# Patient Record
Sex: Male | Born: 1965 | Race: White | Hispanic: No | Marital: Single | Smoking: Former smoker
Health system: Southern US, Community
[De-identification: ages and names within clinical notes are randomized; demographics above are authoritative.]

## PROBLEM LIST (undated history)

## (undated) DIAGNOSIS — S0990XA Unspecified injury of head, initial encounter: Secondary | ICD-10-CM

## (undated) DIAGNOSIS — F4310 Post-traumatic stress disorder, unspecified: Secondary | ICD-10-CM

## (undated) DIAGNOSIS — E119 Type 2 diabetes mellitus without complications: Secondary | ICD-10-CM

## (undated) DIAGNOSIS — I10 Essential (primary) hypertension: Secondary | ICD-10-CM

## (undated) HISTORY — PX: OTHER SURGICAL HISTORY: SHX169

---

## 2002-11-22 ENCOUNTER — Emergency Department (HOSPITAL_COMMUNITY): Admission: EM | Admit: 2002-11-22 | Discharge: 2002-11-22 | Payer: Self-pay | Admitting: Emergency Medicine

## 2003-08-01 ENCOUNTER — Other Ambulatory Visit: Payer: Self-pay

## 2003-08-06 ENCOUNTER — Other Ambulatory Visit: Payer: Self-pay

## 2006-03-07 ENCOUNTER — Emergency Department (HOSPITAL_COMMUNITY): Admission: EM | Admit: 2006-03-07 | Discharge: 2006-03-07 | Payer: Self-pay | Admitting: Emergency Medicine

## 2006-09-28 ENCOUNTER — Emergency Department: Payer: Self-pay | Admitting: Emergency Medicine

## 2007-08-17 ENCOUNTER — Emergency Department: Payer: Self-pay | Admitting: Emergency Medicine

## 2008-10-02 ENCOUNTER — Emergency Department: Payer: Self-pay | Admitting: Emergency Medicine

## 2012-06-09 ENCOUNTER — Emergency Department: Payer: Self-pay | Admitting: Emergency Medicine

## 2016-02-06 ENCOUNTER — Emergency Department
Admission: EM | Admit: 2016-02-06 | Discharge: 2016-02-06 | Disposition: A | Discharge status: Home / Self Care | Payer: Medicare Other | Attending: Student in an Organized Health Care Education/Training Program | Admitting: Student in an Organized Health Care Education/Training Program

## 2016-02-06 ENCOUNTER — Emergency Department: Payer: Medicare Other

## 2016-02-06 DIAGNOSIS — B369 Superficial mycosis, unspecified: Secondary | ICD-10-CM

## 2016-02-06 DIAGNOSIS — L2082 Flexural eczema: Secondary | ICD-10-CM

## 2016-02-06 DIAGNOSIS — Z87891 Personal history of nicotine dependence: Secondary | ICD-10-CM | POA: Diagnosis not present

## 2016-02-06 DIAGNOSIS — R21 Rash and other nonspecific skin eruption: Secondary | ICD-10-CM | POA: Diagnosis present

## 2016-02-06 DIAGNOSIS — B356 Tinea cruris: Secondary | ICD-10-CM | POA: Diagnosis not present

## 2016-02-06 DIAGNOSIS — B353 Tinea pedis: Secondary | ICD-10-CM

## 2016-02-06 DIAGNOSIS — H6240 Otitis externa in other diseases classified elsewhere, unspecified ear: Secondary | ICD-10-CM

## 2016-02-06 HISTORY — DX: Unspecified injury of head, initial encounter: S09.90XA

## 2016-02-06 LAB — GLUCOSE, CAPILLARY: GLUCOSE-CAPILLARY: 136 mg/dL — AB (ref 65–99)

## 2016-02-06 MED ORDER — HYDROCODONE-ACETAMINOPHEN 5-325 MG PO TABS
1.0000 | ORAL_TABLET | Freq: Once | ORAL | Status: AC
  Administered 2016-02-06: 1 via ORAL
  Filled 2016-02-06: qty 1

## 2016-02-06 MED ORDER — CLOTRIMAZOLE 1 % EX CREA
TOPICAL_CREAM | Freq: Two times a day (BID) | CUTANEOUS | Status: DC
  Administered 2016-02-06: 19:00:00 via TOPICAL
  Filled 2016-02-06 (×2): qty 15

## 2016-02-06 MED ORDER — FLUCONAZOLE 50 MG PO TABS
150.0000 mg | ORAL_TABLET | Freq: Once | ORAL | Status: AC
  Administered 2016-02-06: 150 mg via ORAL
  Filled 2016-02-06: qty 1

## 2016-02-06 MED ORDER — CARBAMIDE PEROXIDE 6.5 % OT SOLN
5.0000 [drp] | Freq: Once | OTIC | Status: AC
  Administered 2016-02-06: 5 [drp] via OTIC
  Filled 2016-02-06: qty 15

## 2016-02-06 MED ORDER — CLOTRIMAZOLE 1 % EX SOLN: 1.0000 "application " | Freq: Two times a day (BID) | CUTANEOUS | 0 refills | Status: AC

## 2016-02-06 MED ORDER — EUCERIN EX LOTN: TOPICAL_LOTION | CUTANEOUS | 0 refills | Status: DC | PRN

## 2016-02-06 MED ORDER — CLOTRIMAZOLE-BETAMETHASONE 1-0.05 % EX CREA: TOPICAL_CREAM | CUTANEOUS | 1 refills | Status: AC

## 2016-02-06 MED ORDER — FLUCONAZOLE 150 MG PO TABS: 150.0000 mg | ORAL_TABLET | ORAL | 0 refills | Status: DC

## 2016-02-06 MED ORDER — NEOMYCIN-POLYMYXIN-HC 3.5-10000-1 OT SOLN: 3.0000 [drp] | Freq: Four times a day (QID) | OTIC | 0 refills | Status: DC

## 2016-02-06 NOTE — ED Notes (Signed)
Pt has multiple questions for md. md in to reassess and speak with pt.

## 2016-02-06 NOTE — ED Triage Notes (Signed)
Pt came to ED via EMS from home c/o rash to bilateral feet. Reports noticed it a few weeks ago, but today rash worsened and pt unable to walk. Blister like rash, oozing. VS stable.

## 2016-02-06 NOTE — ED Provider Notes (Signed)
[facility] Emergency Department Provider Note    First MD Initiated Contact with Patient 02/06/16 1857     (approximate)  I have reviewed the triage vital signs and the nursing notes.   HISTORY  Chief Complaint Rash    HPI FALLOU HULBERT is a 50 y.o. male presents with several months of bilateral foot pain and rash spreading to his groin. 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 States that the past 2 days the rash has been unbearable. States that he and his wife have been putting Listerine on the rash try to get it to go away. Denies any fevers no nausea or vomiting. States she is also noted difficulty hearing. States that the rash initially started between his toes on both feet and was burning and itching sensation. With more frequent use of the Listerine his pain became worse he started having blistering. Had severe pain today. Denies any pain in his calf. no Lower STEMI swelling.  Denies any history of being immunocompromised. Denies any history of HIV.   Past Medical History:  Diagnosis Date  . Head injury    No family history on file. Past Surgical History:  Procedure Laterality Date  . head surgery     There are no active problems to display for this patient.     Prior to Admission medications   Not on File    Allergies Patient has no known allergies.    Social History Social History  Substance Use Topics  . Smoking status: Former Research scientist (life sciences)  . Smokeless tobacco: Never Used  . Alcohol use No    Review of Systems Patient denies headaches, rhinorrhea, blurry vision, numbness, shortness of breath, chest pain, edema, cough, abdominal pain, nausea, vomiting, diarrhea, dysuria, fevers, rashes or hallucinations unless otherwise stated above in HPI. ____________________________________________   PHYSICAL EXAM:  VITAL SIGNS: Vitals:   02/06/16 1847  BP: 136/75  Pulse: 80  Resp: 16  Temp: 97.5 F (36.4 C)     Constitutional: Alert and oriented. Well appearing and in no acute distress. Eyes: Conjunctivae are normal. PERRL. EOMI. Head: Atraumatic. Nose: No congestion/rhinnorhea. Mouth/Throat: Mucous membranes are moist.  Oropharynx non-erythematous. No oropharyngeal lesions or blisters. Ears: Thick yellow debris and external ear canal without underlying erythema. The TMs are clear and nonerythematous bilaterally. He has no posterior mastoid tenderness to palpation Neck: No stridor. Painless ROM. No cervical spine tenderness to palpation Hematological/Lymphatic/Immunilogical: No cervical lymphadenopathy. Cardiovascular: Normal rate, regular rhythm. Grossly normal heart sounds.  Good peripheral circulation. Respiratory: Normal respiratory effort.  No retractions. Lungs CTAB. Gastrointestinal: Soft and nontender. No distention. No abdominal bruits. No CVA tenderness. Musculoskeletal: No lower extremity tenderness nor edema.  No joint effusions. Neurologic:  Normal speech and language. No gross focal neurologic deficits are appreciated. No gait instability. Skin:  Has diffuse eczematous changes to bilateral flexural surfaces of upper extremities. Also with evidence of severe tenia pedis without evidence of surrounding cellulitis. He has no crepitus to the feet. No ulcerations. Psychiatric: Mood and affect are normal. Speech and behavior are normal.  ____________________________________________   LABS (all labs ordered are listed, but only abnormal results are displayed)  Results for orders placed or performed during the hospital encounter of 02/06/16 (from the past 24 hour(s))  Glucose, capillary     Status: Abnormal   Collection Time: 02/06/16  6:49 PM  Result Value Ref Range   Glucose-Capillary 136 (H) 65 - 99 mg/dL   ____________________________________________  EKG____________________________________________  RADIOLOGY  I personally reviewed all radiographic images ordered to evaluate  for the above acute complaints and reviewed radiology reports and findings.  These findings were personally discussed with the patient.  Please see medical record for radiology report.  ____________________________________________   PROCEDURES  Procedure(s) performed:  Procedures    Critical Care performed: no ____________________________________________   INITIAL IMPRESSION / ASSESSMENT AND PLAN / ED COURSE  Pertinent labs & imaging results that were available during my care of the patient were reviewed by me and considered in my medical decision making (see chart for details).  DDX: tinea, cellultiis, aom, aoe  Rydge C Conard is a 50 y.o. who presents to the ED with diffuse tinea pedis and cruris as well as O2 mycosis. Patient otherwise afebrile hemodynamically stable. Does not appear to be superinfected at this time. X-ray ordered to evaluate for any evidence of subcutaneous gas shows normal x-ray. Clinically consistent with osteomyelitis. Symptoms have been ongoing for 3 months now and likely worsened by a home remedy with Listerine. Exam and history is consistent with tinea. We'll provide systemic treatment, a prescription for topical clotrimazole. Have provided a Department of bilateral ears and will treat with both antifungal audit drops as well as otic antibiotics.  Presentation is not likely consistent with Stevens-Johnson's or erythema multiforme. Not clinically consistent with necrotizing soft tissue infection. This is clinically consistent with diffuse tinea. Have discussed home hygiene and signs and symptoms for which patient should return to the ER. Patient remains human dynamically stable and in no acute distress. Based on the duration of symptoms I do not feel that laboratory testing clinically indicated at this time.  Patient was able to tolerate PO and was able to ambulate with a steady gait.  Have discussed with the patient and available family all diagnostics and  treatments performed thus far and all questions were answered to the best of my ability. The patient demonstrates understanding and agreement with plan.   Clinical Course      ____________________________________________   FINAL CLINICAL IMPRESSION(S) / ED DIAGNOSES  Final diagnoses:  Tinea pedis of both feet  Otomycosis  Flexural eczema  Tinea cruris      NEW MEDICATIONS STARTED DURING THIS VISIT:  New Prescriptions   No medications on file     Note:  This document was prepared using Dragon voice recognition software and may include unintentional dictation errors.    Merlyn Lot, MD 02/06/16 2138

## 2016-02-06 NOTE — ED Notes (Signed)
Pt with what appears to be bad althelete's foot to bilateral upper feet and to all area of bilateral toes. Pt with rash like tinea extending to bilateral ankles, up to bilateral legs and in groin. Yeast like rash is noted to bilateral groin folds, behind testicles, to folds of panus, and extending up to anterior pubis and lower abd. Pt has eczema like rash noted to bilateral anterior and posterior forearms and upper arms. Pt with eczema like rash noted to forehead beneath band of where pt wears ball cap. Pt denies known fever. Pt states rash has been present for "weeks". Pt states he feels like it is difficult to move his toes. Pt states feet burn and itch and are painful to touch. Pt's feet are weeping serous like fluid. Skin to bilateral feet is scaling and hot to touch.

## 2017-10-13 ENCOUNTER — Observation Stay
Admission: EM | Admit: 2017-10-13 | Discharge: 2017-10-15 | Disposition: A | Discharge status: Home / Self Care | Payer: Medicare Other | Attending: Internal Medicine | Admitting: Internal Medicine

## 2017-10-13 ENCOUNTER — Emergency Department: Payer: Medicare Other

## 2017-10-13 ENCOUNTER — Encounter: Payer: Self-pay | Admitting: Emergency Medicine

## 2017-10-13 DIAGNOSIS — Z87891 Personal history of nicotine dependence: Secondary | ICD-10-CM | POA: Diagnosis not present

## 2017-10-13 DIAGNOSIS — F4310 Post-traumatic stress disorder, unspecified: Secondary | ICD-10-CM | POA: Insufficient documentation

## 2017-10-13 DIAGNOSIS — Z87820 Personal history of traumatic brain injury: Secondary | ICD-10-CM | POA: Insufficient documentation

## 2017-10-13 DIAGNOSIS — T63461A Toxic effect of venom of wasps, accidental (unintentional), initial encounter: Secondary | ICD-10-CM | POA: Diagnosis not present

## 2017-10-13 DIAGNOSIS — R0902 Hypoxemia: Secondary | ICD-10-CM | POA: Diagnosis present

## 2017-10-13 DIAGNOSIS — Z6841 Body Mass Index (BMI) 40.0 and over, adult: Secondary | ICD-10-CM | POA: Insufficient documentation

## 2017-10-13 DIAGNOSIS — J441 Chronic obstructive pulmonary disease with (acute) exacerbation: Secondary | ICD-10-CM

## 2017-10-13 DIAGNOSIS — R0609 Other forms of dyspnea: Secondary | ICD-10-CM | POA: Insufficient documentation

## 2017-10-13 DIAGNOSIS — E1165 Type 2 diabetes mellitus with hyperglycemia: Secondary | ICD-10-CM | POA: Diagnosis not present

## 2017-10-13 DIAGNOSIS — I10 Essential (primary) hypertension: Secondary | ICD-10-CM | POA: Diagnosis not present

## 2017-10-13 DIAGNOSIS — T7840XA Allergy, unspecified, initial encounter: Secondary | ICD-10-CM

## 2017-10-13 DIAGNOSIS — Z79899 Other long term (current) drug therapy: Secondary | ICD-10-CM | POA: Diagnosis not present

## 2017-10-13 DIAGNOSIS — R21 Rash and other nonspecific skin eruption: Secondary | ICD-10-CM | POA: Diagnosis not present

## 2017-10-13 DIAGNOSIS — R0600 Dyspnea, unspecified: Secondary | ICD-10-CM

## 2017-10-13 HISTORY — DX: Post-traumatic stress disorder, unspecified: F43.10

## 2017-10-13 HISTORY — DX: Essential (primary) hypertension: I10

## 2017-10-13 HISTORY — DX: Type 2 diabetes mellitus without complications: E11.9

## 2017-10-13 LAB — CBC WITH DIFFERENTIAL/PLATELET
BASOS ABS: 0.1 10*3/uL (ref 0–0.1)
Basophils Relative: 1 %
Eosinophils Absolute: 0 10*3/uL (ref 0–0.7)
Eosinophils Relative: 1 %
HEMATOCRIT: 44.5 % (ref 40.0–52.0)
HEMOGLOBIN: 15.2 g/dL (ref 13.0–18.0)
LYMPHS PCT: 38 %
Lymphs Abs: 3.1 10*3/uL (ref 1.0–3.6)
MCH: 30.7 pg (ref 26.0–34.0)
MCHC: 34.2 g/dL (ref 32.0–36.0)
MCV: 89.6 fL (ref 80.0–100.0)
Monocytes Absolute: 0.3 10*3/uL (ref 0.2–1.0)
Monocytes Relative: 4 %
NEUTROS ABS: 4.6 10*3/uL (ref 1.4–6.5)
Neutrophils Relative %: 56 %
Platelets: 303 10*3/uL (ref 150–440)
RBC: 4.96 MIL/uL (ref 4.40–5.90)
RDW: 14.6 % — AB (ref 11.5–14.5)
WBC: 8.1 10*3/uL (ref 3.8–10.6)

## 2017-10-13 LAB — COMPREHENSIVE METABOLIC PANEL
ALK PHOS: 74 U/L (ref 38–126)
ALT: 19 U/L (ref 0–44)
AST: 25 U/L (ref 15–41)
Albumin: 3.6 g/dL (ref 3.5–5.0)
Anion gap: 6 (ref 5–15)
BILIRUBIN TOTAL: 0.6 mg/dL (ref 0.3–1.2)
BUN: 20 mg/dL (ref 6–20)
CALCIUM: 8.6 mg/dL — AB (ref 8.9–10.3)
CO2: 26 mmol/L (ref 22–32)
CREATININE: 0.99 mg/dL (ref 0.61–1.24)
Chloride: 106 mmol/L (ref 98–111)
GFR calc Af Amer: >60 mL/min (ref >60)
Glucose, Bld: 169 mg/dL — ABNORMAL HIGH (ref 70–99)
Potassium: 3.8 mmol/L (ref 3.5–5.1)
Sodium: 138 mmol/L (ref 135–145)
TOTAL PROTEIN: 7.6 g/dL (ref 6.5–8.1)

## 2017-10-13 LAB — TROPONIN I

## 2017-10-13 MED ORDER — HYDROCOD POLST-CPM POLST ER 10-8 MG/5ML PO SUER
5.0000 mL | Freq: Once | ORAL | Status: AC
  Administered 2017-10-13: 5 mL via ORAL
  Filled 2017-10-13: qty 5

## 2017-10-13 NOTE — ED Triage Notes (Signed)
Patient brought in from home by ems with complaint of shortness of breath at rest that started about 21:00 tonight. Patient was given 1 duoneb and 125 mg solumedrol by ems.

## 2017-10-13 NOTE — ED Provider Notes (Signed)
[facility] Emergency Department Provider Note   ____________________________________________   None    (approximate)  I have reviewed the triage vital signs and the nursing notes.   HISTORY  Chief Complaint Shortness of Breath    HPI Colin Simpson is a 52 y.o. male brought to the ED from home via EMS with a chief complaint of cough and shortness of breath.  Patient reports nonproductive cough x1 day with progressive shortness of breath.  He was given 125 IV Solu-Medrol and 1 DuoNeb prior to arrival by EMS with improvement in his symptoms.  Denies associated fever, chills, chest pain, abdominal pain, nausea or vomiting.  Denies recent travel or trauma.   Past Medical History:  Diagnosis Date  . Diabetes mellitus without complication (HCC)   . Head injury   . Hypertension     There are no active problems to display for this patient.   Past Surgical History:  Procedure Laterality Date  . head surgery      Prior to Admission medications   Medication Sig Start Date End Date Taking? Authorizing Provider  Emollient (EUCERIN) lotion Apply topically as needed for dry skin. Apply to areas of dry skin 02/06/16   Willy Eddyobinson, Patrick, MD  fluconazole (DIFLUCAN) 150 MG tablet Take 1 tablet (150 mg total) by mouth once a week. 02/06/16   Willy Eddyobinson, Patrick, MD  neomycin-polymyxin-hydrocortisone (CORTISPORIN) otic solution Place 3 drops into both ears 4 (four) times daily. 02/06/16   Willy Eddyobinson, Patrick, MD    Allergies Patient has no known allergies.  No family history on file.  Social History Social History   Tobacco Use  . Smoking status: Former Games developermoker  . Smokeless tobacco: Never Used  Substance Use Topics  . Alcohol use: No  . Drug use: No    Review of Systems  Constitutional: No fever/chills Eyes: No visual changes. ENT: No sore throat. Cardiovascular: Denies chest pain. Respiratory: Positive for nonproductive cough and shortness of  breath. Gastrointestinal: No abdominal pain.  No nausea, no vomiting.  No diarrhea.  No constipation. Genitourinary: Negative for dysuria. Musculoskeletal: Negative for back pain. Skin: Negative for rash. Neurological: Negative for headaches, focal weakness or numbness.   ____________________________________________   PHYSICAL EXAM:  VITAL SIGNS: ED Triage Vitals  Enc Vitals Group     BP 10/13/17 2301 127/83     Pulse Rate 10/13/17 2301 76     Resp 10/13/17 2301 20     Temp --      Temp src --      SpO2 10/13/17 2301 100 %     Weight 10/13/17 2302 (!) 304 lb (137.9 kg)     Height 10/13/17 2302 5\' 10"  (1.778 m)     Head Circumference --      Peak Flow --      Pain Score 10/13/17 2302 8     Pain Loc --      Pain Edu? --      Excl. in GC? --     Constitutional: Alert and oriented. Well appearing and in mild acute distress. Eyes: Conjunctivae are normal. PERRL. EOMI. Head: Atraumatic. Nose: No congestion/rhinnorhea. Mouth/Throat: Mucous membranes are moist.  Oropharynx non-erythematous. Neck: No stridor.   Cardiovascular: Normal rate, regular rhythm. Grossly normal heart sounds.  Good peripheral circulation. Respiratory: Normal respiratory effort.  No retractions. Lungs with scattered rhonchi. Gastrointestinal: Obese.  Soft and nontender. No distention. No abdominal bruits. No CVA tenderness. Musculoskeletal: No lower extremity tenderness nor edema.  No  joint effusions. Neurologic:  Normal speech and language. No gross focal neurologic deficits are appreciated.  Skin:  Skin is warm, dry and intact. No rash noted.  Patchy areas of psoriasis noted on extremities. Psychiatric: Mood and affect are normal. Speech and behavior are normal.  ____________________________________________   LABS (all labs ordered are listed, but only abnormal results are displayed)  Labs Reviewed  CBC WITH DIFFERENTIAL/PLATELET - Abnormal; Notable for the following components:      Result Value     RDW 14.6 (*)    All other components within normal limits  COMPREHENSIVE METABOLIC PANEL - Abnormal; Notable for the following components:   Glucose, Bld 169 (*)    Calcium 8.6 (*)    All other components within normal limits  TROPONIN I  BRAIN NATRIURETIC PEPTIDE   ____________________________________________  EKG  ED ECG REPORT I, SUNG,JADE J, the attending physician, personally viewed and interpreted this ECG.   Date: 10/13/2017  EKG Time: 2301  Rate: 78  Rhythm: normal EKG, normal sinus rhythm  Axis: Normal  Intervals:none  ST&T Change: Nonspecific  ____________________________________________  RADIOLOGY  ED MD interpretation: No acute cardiopulmonary process  Official radiology report(s): Dg Chest 2 View  Result Date: 10/13/2017 CLINICAL DATA:  Patient with shortness of breath EXAM: CHEST - 2 VIEW COMPARISON:  None. FINDINGS: Normal cardiac and mediastinal contours. Elevation right hemidiaphragm. No consolidative pulmonary opacities. No pleural effusion or pneumothorax. Thoracic spine degenerative changes. IMPRESSION: No acute cardiopulmonary process. Electronically Signed   By: Annia Belt M.D.   On: 10/13/2017 23:56    ____________________________________________   PROCEDURES  Procedure(s) performed: None  Procedures  Critical Care performed: Yes, see critical care note(s)   CRITICAL CARE Performed by: Irean Hong   Total critical care time: 30 minutes  Critical care time was exclusive of separately billable procedures and treating other patients.  Critical care was necessary to treat or prevent imminent or life-threatening deterioration.  Critical care was time spent personally by me on the following activities: development of treatment plan with patient and/or surrogate as well as nursing, discussions with consultants, evaluation of patient's response to treatment, examination of patient, obtaining history from patient or surrogate, ordering and  performing treatments and interventions, ordering and review of laboratory studies, ordering and review of radiographic studies, pulse oximetry and re-evaluation of patient's condition.  ____________________________________________   INITIAL IMPRESSION / ASSESSMENT AND PLAN / ED COURSE  As part of my medical decision making, I reviewed the following data within the electronic MEDICAL RECORD NUMBER Nursing notes reviewed and incorporated, Labs reviewed, EKG interpreted, Old chart reviewed, Radiograph reviewed and Notes from prior ED visits   52 year old male with diabetes and hypertension, former smoker, who presents with nonproductive cough and shortness of breath. Differential includes, but is not limited to, viral syndrome, bronchitis including COPD exacerbation, pneumonia, reactive airway disease including asthma, CHF including exacerbation with or without pulmonary/interstitial edema, pneumothorax, ACS, thoracic trauma, and pulmonary embolism.  Patient feeling better after DuoNeb and 125 IV Solu-Medrol administered by EMS.  Will check basic lab work, troponin, chest x-ray.  Administer Tussionex for cough and reassess.   Clinical Course as of Oct 14 109  Sat Oct 14, 2017  0108 Patient's room air saturations 90%.  Feels like he needs a additional breathing treatment.  Lungs are diminished on reexamination.  Also now a family member is at the bedside and tells me his face looks swollen.  Patient denies new medicines or exposure.  Unsure if he takes  lisinopril.  There is no tongue angioedema.  No need for IM epinephrine at this time for possible allergic reaction.  Will administer second DuoNeb and discuss with hospitalist services to evaluate patient in emergency department for admission.   [JS]    Clinical Course User Index [JS] Irean Hong, MD     ____________________________________________   FINAL CLINICAL IMPRESSION(S) / ED DIAGNOSES  Final diagnoses:  COPD exacerbation (HCC)    Hypoxia  Allergic reaction, initial encounter     ED Discharge Orders    None       Note:  This document was prepared using Dragon voice recognition software and may include unintentional dictation errors.   Irean Hong, MD 10/14/17 [id number]

## 2017-10-14 ENCOUNTER — Encounter: Payer: Self-pay | Admitting: Internal Medicine

## 2017-10-14 ENCOUNTER — Other Ambulatory Visit: Payer: Self-pay

## 2017-10-14 DIAGNOSIS — R0600 Dyspnea, unspecified: Secondary | ICD-10-CM

## 2017-10-14 LAB — GLUCOSE, CAPILLARY
GLUCOSE-CAPILLARY: 162 mg/dL — AB (ref 70–99)
Glucose-Capillary: 155 mg/dL — ABNORMAL HIGH (ref 70–99)
Glucose-Capillary: 184 mg/dL — ABNORMAL HIGH (ref 70–99)
Glucose-Capillary: 134 mg/dL — ABNORMAL HIGH (ref 70–99)
Glucose-Capillary: 128 mg/dL — ABNORMAL HIGH (ref 70–99)

## 2017-10-14 LAB — BRAIN NATRIURETIC PEPTIDE: B Natriuretic Peptide: 25 pg/mL (ref 0.0–100.0)

## 2017-10-14 MED ORDER — CHOLECALCIFEROL 10 MCG (400 UNIT) PO TABS
400.0000 [IU] | ORAL_TABLET | Freq: Every day | ORAL | Status: DC
  Administered 2017-10-15: 400 [IU] via ORAL
  Filled 2017-10-14: qty 1

## 2017-10-14 MED ORDER — IPRATROPIUM-ALBUTEROL 0.5-2.5 (3) MG/3ML IN SOLN
3.0000 mL | Freq: Four times a day (QID) | RESPIRATORY_TRACT | Status: DC
  Administered 2017-10-14: 3 mL via RESPIRATORY_TRACT
  Filled 2017-10-14 (×2): qty 3

## 2017-10-14 MED ORDER — ACETAMINOPHEN 325 MG PO TABS: 650.0000 mg | ORAL_TABLET | Freq: Four times a day (QID) | ORAL | Status: DC | PRN

## 2017-10-14 MED ORDER — MUPIROCIN CALCIUM 2 % EX CREA
TOPICAL_CREAM | Freq: Two times a day (BID) | CUTANEOUS | Status: DC
  Administered 2017-10-14: 08:00:00 via TOPICAL
  Administered 2017-10-14: 1 via TOPICAL
  Administered 2017-10-14 – 2017-10-15 (×2): via TOPICAL
  Filled 2017-10-14: qty 15

## 2017-10-14 MED ORDER — ENOXAPARIN SODIUM 40 MG/0.4ML ~~LOC~~ SOLN
40.0000 mg | Freq: Two times a day (BID) | SUBCUTANEOUS | Status: DC
  Administered 2017-10-14 (×2): 40 mg via SUBCUTANEOUS
  Filled 2017-10-14 (×2): qty 0.4

## 2017-10-14 MED ORDER — SIMVASTATIN 20 MG PO TABS
40.0000 mg | ORAL_TABLET | Freq: Every day | ORAL | Status: DC
  Administered 2017-10-14: 40 mg via ORAL
  Filled 2017-10-14: qty 2

## 2017-10-14 MED ORDER — ONDANSETRON HCL 4 MG/2ML IJ SOLN: 4.0000 mg | Freq: Four times a day (QID) | INTRAMUSCULAR | Status: DC | PRN

## 2017-10-14 MED ORDER — IPRATROPIUM-ALBUTEROL 0.5-2.5 (3) MG/3ML IN SOLN
3.0000 mL | Freq: Once | RESPIRATORY_TRACT | Status: AC
  Administered 2017-10-14: 3 mL via RESPIRATORY_TRACT

## 2017-10-14 MED ORDER — PREDNISONE 20 MG PO TABS
40.0000 mg | ORAL_TABLET | Freq: Every day | ORAL | Status: DC
  Administered 2017-10-14 – 2017-10-15 (×2): 40 mg via ORAL
  Filled 2017-10-14 (×2): qty 2

## 2017-10-14 MED ORDER — BISACODYL 5 MG PO TBEC: 5.0000 mg | DELAYED_RELEASE_TABLET | Freq: Every day | ORAL | Status: DC | PRN

## 2017-10-14 MED ORDER — IPRATROPIUM-ALBUTEROL 0.5-2.5 (3) MG/3ML IN SOLN: 3.0000 mL | RESPIRATORY_TRACT | Status: DC | PRN

## 2017-10-14 MED ORDER — SODIUM CHLORIDE 0.9 % IV BOLUS: 500.0000 mL | Freq: Once | INTRAVENOUS | Status: DC

## 2017-10-14 MED ORDER — DIPHENHYDRAMINE HCL 25 MG PO CAPS
25.0000 mg | ORAL_CAPSULE | Freq: Four times a day (QID) | ORAL | Status: DC | PRN
  Administered 2017-10-14 (×2): 25 mg via ORAL
  Filled 2017-10-14 (×2): qty 1

## 2017-10-14 MED ORDER — INSULIN ASPART 100 UNIT/ML ~~LOC~~ SOLN: 0.0000 [IU] | Freq: Every day | SUBCUTANEOUS | Status: DC

## 2017-10-14 MED ORDER — PAROXETINE HCL 10 MG PO TABS
10.0000 mg | ORAL_TABLET | Freq: Every day | ORAL | Status: DC
  Administered 2017-10-15: 10 mg via ORAL
  Filled 2017-10-14 (×2): qty 1

## 2017-10-14 MED ORDER — INSULIN ASPART 100 UNIT/ML ~~LOC~~ SOLN
0.0000 [IU] | Freq: Three times a day (TID) | SUBCUTANEOUS | Status: DC
  Administered 2017-10-14: 18:00:00 1 [IU] via SUBCUTANEOUS
  Administered 2017-10-14 (×2): 2 [IU] via SUBCUTANEOUS
  Filled 2017-10-14 (×3): qty 1

## 2017-10-14 MED ORDER — SENNOSIDES-DOCUSATE SODIUM 8.6-50 MG PO TABS: 1.0000 | ORAL_TABLET | Freq: Every evening | ORAL | Status: DC | PRN

## 2017-10-14 MED ORDER — ONDANSETRON HCL 4 MG PO TABS: 4.0000 mg | ORAL_TABLET | Freq: Four times a day (QID) | ORAL | Status: DC | PRN

## 2017-10-14 MED ORDER — ACETAMINOPHEN 650 MG RE SUPP: 650.0000 mg | Freq: Four times a day (QID) | RECTAL | Status: DC | PRN

## 2017-10-14 MED ORDER — IPRATROPIUM-ALBUTEROL 0.5-2.5 (3) MG/3ML IN SOLN
RESPIRATORY_TRACT | Status: AC
  Filled 2017-10-14: qty 3

## 2017-10-14 MED ORDER — PANTOPRAZOLE SODIUM 40 MG PO TBEC
40.0000 mg | DELAYED_RELEASE_TABLET | Freq: Every day | ORAL | Status: DC
  Administered 2017-10-15: 08:00:00 40 mg via ORAL
  Filled 2017-10-14: qty 1

## 2017-10-14 NOTE — Plan of Care (Signed)
  Problem: Education: Goal: Knowledge of General Education information will improve Description Including pain rating scale, medication(s)/side effects and non-pharmacologic comfort measures Outcome: Progressing   Problem: Health Behavior/Discharge Planning: Goal: Ability to manage health-related needs will improve Outcome: Progressing  Pt able to verbalize that he was stung by a Wasp prior to admission on his left posterior neck area Problem: Clinical Measurements: Goal: Will remain free from infection Outcome: Progressing Goal: Respiratory complications will improve Outcome: Progressing  PRN Benadryl ordered by Dr Imogene Burnhen this shift d/t h/o Wasp sting Problem: Activity: Goal: Risk for activity intolerance will decrease Outcome: Progressing  Pt independent in room; remains on low fall risks Problem: Nutrition: Goal: Adequate nutrition will be maintained Outcome: Progressing  Pt on Heart Healthy/Carb Modified diet Problem: Pain Managment: Goal: General experience of comfort will improve Outcome: Progressing  Pt denies pain; only difficulty swallowing Problem: Safety: Goal: Ability to remain free from injury will improve Outcome: Progressing  Pt remains on low fall risks this shift

## 2017-10-14 NOTE — Plan of Care (Signed)

## 2017-10-14 NOTE — Care Management (Signed)
Patient admitted to [facility] under observation status for dyspnea. RNCM consulted on patient to provide MOON letter and complete assessment. Patient currently [place] with his parents and sister [place]. Patient is independent at baseline and is able to complete activities of daily living. Family provides transport. PCP is Franco Nonesheryl Lindley. No issues obtaining medications.

## 2017-10-14 NOTE — H&P (Addendum)
[employer] [facility] [place] [facility]   PATIENT NAME: Colin SalmonRickie Simpson    MR#:  [id number]  DATE OF BIRTH:  [birth year]  DATE OF ADMISSION:  10/13/2017  PRIMARY CARE PHYSICIAN: Armando GangLindley, Cheryl P, FNP   REQUESTING/REFERRING PHYSICIAN: Irean HongSung, Jade J, MD  CHIEF COMPLAINT:   Chief Complaint  Patient presents with  . Shortness of Breath    HISTORY OF PRESENT ILLNESS:  Colin Simpson  is a 52 y.o. male with a known history of morbid obesity, T2NIDDM, HTN, TBI, PTSD p/w 1d Hx sudden-onset dyspnea. Pt w/ Hx TBI + PTSD, 2/2 fall off ladder 14-7237yrs ago. Seems to have some degree of intellectual disability, but is AAOx3, and is able to provide some limited Hx relating to present admission. He answers questions, follows commands and responds appropriately. Circumstances surrounding pt's hospitalization also d/w pt's family at bedside, as well as w/ pt's sister over the phone. The family is able to provide exceedingly little additional information relating to pt's present admission. They also seem to be unable to tell me what specific medications the pt is presently taking at home, and can only list broad drug groups (i.e. "blood pressure medication" and "a statin").  Pt states that he felt fine up until Friday 10/13/2017 evening. @~1800PM, pt developed sudden acute SOB, difficulty speaking. Onset while pt was at rest watching television. Endorses cough, intermittently productive of small qty clear sputum. Denies hemoptysis or chest pain. Denies diaphoresis and rigors. Endorses swelling of the face and lips around the time the SOB started. Denies any other complaints, states he otherwise feels fine. His family says he was started on new medication recently, but no further specific information is provided. I was initially told pt may possibly be suffering from a COPD exacerbation, but he is a non-smoker and does not have Hx of COPD. His lungs are clear, but air movement is impaired. I have  a greater suspicion for an allergic reaction of some sort, w/ reactive airways. Etiological agent presently unclear.  Pt also appears to have a rash on his B/L feet up to the ankles. He has a similar mild process on the posterior of his B/L hands, as well as on his forehead. Process appears psoriatic or eczematous, possibly vasculitic, less likely infectious. Based on what I am being told by family, pt has received multiple courses of Tx w/ outpt ABx, w/o improvement of rash. This does not appear to be a new issue, w/ 02/06/2016 ED documentation noting presence of B/L foot rash.  PAST MEDICAL HISTORY:   Past Medical History:  Diagnosis Date  . Diabetes mellitus without complication (HCC)   . Head injury   . Hypertension     PAST SURGICAL HISTORY:   Past Surgical History:  Procedure Laterality Date  . head surgery      SOCIAL HISTORY:   Social History   Tobacco Use  . Smoking status: Former Games developermoker  . Smokeless tobacco: Never Used  Substance Use Topics  . Alcohol use: No    FAMILY HISTORY:  History reviewed. No pertinent family history.  DRUG ALLERGIES:  No Known Allergies  REVIEW OF SYSTEMS:   Review of Systems  Constitutional: Negative for chills, diaphoresis, fever, malaise/fatigue and weight loss.  HENT: Negative for congestion, ear pain, hearing loss, nosebleeds, sinus pain, sore throat and tinnitus.   Eyes: Negative for blurred vision, double vision and photophobia.  Respiratory: Positive for cough, sputum production and shortness of breath. Negative for hemoptysis and wheezing.  Cardiovascular: Negative for chest pain, palpitations, orthopnea, claudication, leg swelling and PND.  Gastrointestinal: Negative for abdominal pain, blood in stool, constipation, diarrhea, heartburn, melena, nausea and vomiting.  Genitourinary: Negative for dysuria, frequency, hematuria and urgency.  Musculoskeletal: Negative for back pain, joint pain, myalgias and neck pain.  Skin:  Positive for rash. Negative for itching.  Neurological: Negative for dizziness, tingling, tremors, sensory change, speech change, focal weakness, seizures, loss of consciousness, weakness and headaches.  Psychiatric/Behavioral: Negative for memory loss. The patient does not have insomnia.    MEDICATIONS AT HOME:   Prior to Admission medications   Not on File   VITAL SIGNS:  Blood pressure 127/83, pulse 76, temperature 97.8 F (36.6 C), temperature source Oral, resp. rate 20, height 5\' 10"  (1.778 m), weight (!) 137.9 kg, SpO2 100 %.  PHYSICAL EXAMINATION:  Physical Exam  Constitutional: He is oriented to person, place, and time. He appears well-developed and well-nourished. He is active and cooperative.  Non-toxic appearance. He does not have a sickly appearance. He does not appear ill. No distress. He is not intubated. Nasal cannula in place.  HENT:  Head: Atraumatic.  Mouth/Throat: Oropharynx is clear and moist. No oropharyngeal exudate.  Eyes: EOM and lids are normal.  Neck: Neck supple. No JVD present. No thyromegaly present.  Cardiovascular: Normal rate, regular rhythm, S1 normal and S2 normal.  No extrasystoles are present. Exam reveals no gallop, no S3, no S4, no distant heart sounds and no friction rub.  Murmur heard.  Systolic murmur is present with a grade of 2/6. Pulmonary/Chest: Effort normal. No accessory muscle usage or stridor. No apnea, no tachypnea and no bradypnea. He is not intubated. No respiratory distress. He has decreased breath sounds in the right upper field, the right middle field, the right lower field, the left upper field, the left middle field and the left lower field. He has no wheezes. He has no rhonchi. He has no rales.  Abdominal: Soft. Bowel sounds are normal. He exhibits no distension. There is no tenderness. There is no rigidity, no rebound and no guarding.  Musculoskeletal:       Right lower leg: He exhibits no edema.       Left lower leg: He exhibits  no edema.  Feet:  Right Foot:  Skin Integrity: Positive for skin breakdown.  Left Foot:  Skin Integrity: Positive for skin breakdown.  Lymphadenopathy:    He has no cervical adenopathy.  Neurological: He is alert and oriented to person, place, and time. He is not disoriented.  Skin: Skin is warm and dry. Rash noted. He is not diaphoretic.  Psychiatric: He has a normal mood and affect. His speech is normal and behavior is normal. Judgment and thought content normal. His mood appears not anxious. He is not agitated. Cognition and memory are impaired.   (-) wheezing, (+) poor air mvmt. (-) leg edema. (+) B/L foot rash up to ankles. LABORATORY PANEL:   CBC Recent Labs  Lab 10/13/17 2310  WBC 8.1  HGB 15.2  HCT 44.5  PLT 303   ------------------------------------------------------------------------------------------------------------------  Chemistries  Recent Labs  Lab 10/13/17 2310  NA 138  K 3.8  CL 106  CO2 26  GLUCOSE 169*  BUN 20  CREATININE 0.99  CALCIUM 8.6*  AST 25  ALT 19  ALKPHOS 74  BILITOT 0.6   ------------------------------------------------------------------------------------------------------------------  Cardiac Enzymes Recent Labs  Lab 10/13/17 2310  TROPONINI <0.03   ------------------------------------------------------------------------------------------------------------------  RADIOLOGY:  Dg Chest 2 View  Result  Date: 10/13/2017 CLINICAL DATA:  Patient with shortness of breath EXAM: CHEST - 2 VIEW COMPARISON:  None. FINDINGS: Normal cardiac and mediastinal contours. Elevation right hemidiaphragm. No consolidative pulmonary opacities. No pleural effusion or pneumothorax. Thoracic spine degenerative changes. IMPRESSION: No acute cardiopulmonary process. Electronically Signed   By: Annia Belt M.D.   On: 10/13/2017 23:56   IMPRESSION AND PLAN:   A/P: 85M p/w 1d Hx dyspnea, transient hypoxia. Suspect allergic rxn, reactive airways. B/L foot  rash. Hyperglycemia (w/ T2NIDDM), hypocalcemia. -Dyspnea/transient hypoxia: Pt p/w SOB, transient drop in SpO2 to 90% in ED. SpO2 100% on [place] at the time of my assessment. Narrative is not convincing for a COPD exacerbation. Non-smoker. Pt has clear lungs. He is not in distress. Endorses swelling of face and lips. May have recently been started on a new medication. Hx is unclear. I have asked pt's family to put all of pt's medications in a bag and bring them to the hospital for review. We do not have his medication list at present. Will treat w/ PO steroids and nebs for now. Incentive spirometry. With regards to pt's dyspnea and transient hypoxia, there may also potentially be components of underlying/undiagnosed OHS/Pickwickian, restrictive lung disease 2/2 body habitus, and OSA. -B/L foot rash: Pt w/ rash of B/L feet up to ankles, appears psoriatic vs. eczematous vs. vasculitic. I was told some peculiar story about a horse farm and boots, but I don't think this is the issue. Suspicion for active infectious process is lower, though the possibility of mild superimposed infxn (2/2 skin breakdown) certainly exists. Mupirocin. Appears to have a milder form of a similar process on B/L posterior hands and forehead. May benefit from Dermatology or Rheumatology consultation, skin Bx for definitive Dx. PO steroids as above. -Hyperglycemia/T2NIDDM: SSI, diabetic diet. -Hypocalcemia: Ionized calcium. -Pt reportedly on "blood pressure medication" and "a statin". Specifics are not available. Need home medication list, have requested family to retrieve meds for inpt review (as above). -FEN/GI: Cardiac diabetic diet. -DVT PPx: Lovenox. -Code status: Full code. -Disposition: Observation, < 2 midnights.   All the records are reviewed and case discussed with ED provider. Management plans discussed with the patient, family and they are in agreement.  CODE STATUS: Full code.  TOTAL TIME TAKING CARE OF THIS PATIENT: 75  minutes.    Barbaraann Rondo M.D on 10/14/2017 at 1:53 AM  Between 7am to 6pm - Pager - [phone]  After 6pm go to [email or web address] - Social research officer, government  [facility]  Office  [phone]  CC: Primary care physician; Armando Gang, FNP   Note: This dictation was prepared with Dragon dictation along with smaller phrase technology. Any transcriptional errors that result from this process are unintentional.

## 2017-10-14 NOTE — Progress Notes (Signed)
Per the patient and RN, the patient got wasp sting the day before admission. Better shortness of breath today.  No stridor.  Still has facial and swelling. Vital signs are stable.  Dyspnea and transient hypoxia.  Possible due to allergy to wasp sting.  The patient does need EpiPen as needed. Continue prednisone, nebulizer as needed. Continue other treatment.  I discussed with the patient and RN.  Time spent about 25 minutes.

## 2017-10-14 NOTE — Care Management Obs Status (Signed)
MEDICARE OBSERVATION STATUS NOTIFICATION   Patient Details  Name: Colin Simpson MRN: [id number] Date of Birth: August 14, 1965   Medicare Observation Status Notification Given:  Yes    Randall Rampersad A Makalia Bare, RN 10/14/2017, 8:59 AM

## 2017-10-15 LAB — BASIC METABOLIC PANEL
ANION GAP: 6 (ref 5–15)
BUN: 22 mg/dL — ABNORMAL HIGH (ref 6–20)
CALCIUM: 8.3 mg/dL — AB (ref 8.9–10.3)
CO2: 26 mmol/L (ref 22–32)
CREATININE: 1.03 mg/dL (ref 0.61–1.24)
Chloride: 106 mmol/L (ref 98–111)
GFR calc Af Amer: >60 mL/min (ref >60)
GFR calc non Af Amer: >60 mL/min (ref >60)
GLUCOSE: 113 mg/dL — AB (ref 70–99)
Potassium: 4 mmol/L (ref 3.5–5.1)
Sodium: 138 mmol/L (ref 135–145)

## 2017-10-15 LAB — GLUCOSE, CAPILLARY: Glucose-Capillary: 103 mg/dL — ABNORMAL HIGH (ref 70–99)

## 2017-10-15 MED ORDER — EPINEPHRINE 0.3 MG/0.3ML IJ SOAJ
0.3000 mg | INTRAMUSCULAR | Status: DC | PRN
  Filled 2017-10-15: qty 0.3

## 2017-10-15 MED ORDER — PREDNISONE 20 MG PO TABS: 40.0000 mg | ORAL_TABLET | Freq: Every day | ORAL | 0 refills | Status: DC

## 2017-10-15 MED ORDER — EPINEPHRINE 0.3 MG/0.3ML IJ SOAJ: 0.3000 mg | INTRAMUSCULAR | 1 refills | Status: DC | PRN

## 2017-10-15 MED ORDER — DIPHENHYDRAMINE HCL 25 MG PO CAPS: 25.0000 mg | ORAL_CAPSULE | Freq: Four times a day (QID) | ORAL | 0 refills | Status: DC | PRN

## 2017-10-15 NOTE — Progress Notes (Signed)
MD order received in [facility] to discharge pt home today; verbally reviewed AVS with pt and pt's brother-in-law, Maurine Minister; no questions voiced at this time

## 2017-10-15 NOTE — Discharge Summary (Signed)
[employer] - [facility]   PATIENT NAME: Colin Simpson    MR#:  [id number]  DATE OF BIRTH:  05-06-1965  DATE OF ADMISSION:  10/13/2017   ADMITTING PHYSICIAN: Barbaraann Rondo, MD  DATE OF DISCHARGE: 10/15/2017 PRIMARY CARE PHYSICIAN: Armando Gang, FNP   ADMISSION DIAGNOSIS:  Hypoxia [R09.02] COPD exacerbation (HCC) [J44.1] Allergic reaction, initial encounter [T78.40XA] DISCHARGE DIAGNOSIS:  Active Problems:   Dyspnea  SECONDARY DIAGNOSIS:   Past Medical History:  Diagnosis Date  . Diabetes mellitus without complication (HCC)   . Head injury   . Hypertension   . PTSD (post-traumatic stress disorder)    HOSPITAL COURSE:  Per the patient and RN, the patient got wasp sting the day before admission. No shortness of breath or stridor. Better facial and swelling.  Dyspnea and transient hypoxia, due to allergy to wasp sting.  The patient does need EpiPen as needed. Continue prednisone and benadryl prn. Continue other treatment. DISCHARGE CONDITIONS:  Stable, discharge to home today. CONSULTS OBTAINED:  Treatment Team:  Barbaraann Rondo, MD DRUG ALLERGIES:  No Known Allergies DISCHARGE MEDICATIONS:   Allergies as of 10/15/2017   No Known Allergies     Medication List    STOP taking these medications   levofloxacin 750 MG tablet Commonly known as:  LEVAQUIN     TAKE these medications   cholecalciferol 400 units Tabs tablet Commonly known as:  VITAMIN D Take 400 Units by mouth daily.   diphenhydrAMINE 25 mg capsule Commonly known as:  BENADRYL Take 1 capsule (25 mg total) by mouth every 6 (six) hours as needed for itching, allergies or sleep.   EPINEPHrine 0.3 mg/0.3 mL Soaj injection Commonly known as:  EPI-PEN Inject 0.3 mLs (0.3 mg total) into the muscle as needed (for hypersensitivity from wasp sting).   pantoprazole 40 MG tablet Commonly known as:  PROTONIX Take 40 mg by mouth daily.   PARoxetine 10 MG  tablet Commonly known as:  PAXIL Take 10 mg by mouth daily.   predniSONE 20 MG tablet Commonly known as:  DELTASONE Take 2 tablets (40 mg total) by mouth daily with breakfast.   simvastatin 40 MG tablet Commonly known as:  ZOCOR Take 40 mg by mouth daily at 6 PM.   terbinafine 250 MG tablet Commonly known as:  LAMISIL Take 250 mg by mouth daily. 30 tablets dispensed; no refills        DISCHARGE INSTRUCTIONS:  See AVS.  If you experience worsening of your admission symptoms, develop shortness of breath, life threatening emergency, suicidal or homicidal thoughts you must seek medical attention immediately by calling 911 or calling your MD immediately  if symptoms less severe.  You Must read complete instructions/literature along with all the possible adverse reactions/side effects for all the Medicines you take and that have been prescribed to you. Take any new Medicines after you have completely understood and accpet all the possible adverse reactions/side effects.   Please note  You were cared for by a hospitalist during your hospital stay. If you have any questions about your discharge medications or the care you received while you were in the hospital after you are discharged, you can call the unit and asked to speak with the hospitalist on call if the hospitalist that took care of you is not available. Once you are discharged, your primary care physician will handle any further medical issues. Please note that NO REFILLS for any discharge medications will be authorized once you are  discharged, as it is imperative that you return to your primary care physician (or establish a relationship with a primary care physician if you do not have one) for your aftercare needs so that they can reassess your need for medications and monitor your lab values.    On the day of Discharge:  VITAL SIGNS:  Blood pressure 120/67, pulse 70, temperature 98 F (36.7 C), temperature source Oral, resp.  rate 18, height 5\' 10"  (1.778 m), weight (!) 140 kg, SpO2 98 %. PHYSICAL EXAMINATION:  GENERAL:  52 y.o.-year-old patient lying in the bed with no acute distress. Morbid obesity. EYES: Pupils equal, round, reactive to light and accommodation. No scleral icterus. Extraocular muscles intact.  HEENT: Head atraumatic, normocephalic. Oropharynx and nasopharynx clear.  NECK:  Supple, no jugular venous distention. No thyroid enlargement, no tenderness.  LUNGS: Normal breath sounds bilaterally, no wheezing, rales,rhonchi or crepitation. No use of accessory muscles of respiration.  CARDIOVASCULAR: S1, S2 normal. No murmurs, rubs, or gallops.  ABDOMEN: Soft, non-tender, non-distended. Bowel sounds present. No organomegaly or mass.  EXTREMITIES: No pedal edema, cyanosis, or clubbing.  NEUROLOGIC: Cranial nerves II through XII are intact. Muscle strength 5/5 in all extremities. Sensation intact. Gait not checked.  PSYCHIATRIC: The patient is alert and oriented x 3.  SKIN: No obvious rash, lesion, or ulcer.  DATA REVIEW:   CBC Recent Labs  Lab 10/13/17 2310  WBC 8.1  HGB 15.2  HCT 44.5  PLT 303    Chemistries  Recent Labs  Lab 10/13/17 2310 10/15/17 0514  NA 138 138  K 3.8 4.0  CL 106 106  CO2 26 26  GLUCOSE 169* 113*  BUN 20 22*  CREATININE 0.99 1.03  CALCIUM 8.6* 8.3*  AST 25  --   ALT 19  --   ALKPHOS 74  --   BILITOT 0.6  --      Microbiology Results  No results found for this or any previous visit.  RADIOLOGY:  No results found.   Management plans discussed with the patient, family and they are in agreement.  CODE STATUS: Full Code   TOTAL TIME TAKING CARE OF THIS PATIENT:26 minutes.    Shaune Pollack M.D on 10/15/2017 at 11:16 AM  Between 7am to 6pm - Pager - [phone]  After 6pm go to [email or web address] - Social research officer, government  [facility] [place]  Office  [phone]  CC: Primary care physician; Armando Gang, FNP   Note: This dictation  was prepared with Dragon dictation along with smaller phrase technology. Any transcriptional errors that result from this process are unintentional.

## 2017-10-15 NOTE — Progress Notes (Signed)
Pt discharged via wheelchair by nursing to the visitor's entrance 

## 2017-10-17 LAB — CALCIUM, IONIZED: Calcium, Ionized, Serum: 4.7 mg/dL (ref 4.5–5.6)

## 2017-10-17 LAB — HIV ANTIBODY (ROUTINE TESTING W REFLEX): HIV SCREEN 4TH GENERATION: NONREACTIVE

## 2021-07-19 ENCOUNTER — Observation Stay
Admission: EM | Admit: 2021-07-19 | Discharge: 2021-07-20 | Disposition: A | Discharge status: Home / Self Care | Payer: Medicare HMO | Attending: Internal Medicine | Admitting: Internal Medicine

## 2021-07-19 ENCOUNTER — Encounter: Payer: Self-pay | Admitting: Internal Medicine

## 2021-07-19 ENCOUNTER — Emergency Department: Payer: Medicare HMO

## 2021-07-19 ENCOUNTER — Other Ambulatory Visit: Payer: Self-pay

## 2021-07-19 DIAGNOSIS — E782 Mixed hyperlipidemia: Secondary | ICD-10-CM

## 2021-07-19 DIAGNOSIS — I10 Essential (primary) hypertension: Secondary | ICD-10-CM | POA: Insufficient documentation

## 2021-07-19 DIAGNOSIS — Z87891 Personal history of nicotine dependence: Secondary | ICD-10-CM | POA: Diagnosis not present

## 2021-07-19 DIAGNOSIS — A419 Sepsis, unspecified organism: Secondary | ICD-10-CM | POA: Diagnosis not present

## 2021-07-19 DIAGNOSIS — R34 Anuria and oliguria: Secondary | ICD-10-CM | POA: Diagnosis not present

## 2021-07-19 DIAGNOSIS — D72829 Elevated white blood cell count, unspecified: Secondary | ICD-10-CM | POA: Diagnosis not present

## 2021-07-19 DIAGNOSIS — R109 Unspecified abdominal pain: Secondary | ICD-10-CM | POA: Diagnosis not present

## 2021-07-19 DIAGNOSIS — J449 Chronic obstructive pulmonary disease, unspecified: Secondary | ICD-10-CM | POA: Diagnosis not present

## 2021-07-19 DIAGNOSIS — Z20822 Contact with and (suspected) exposure to covid-19: Secondary | ICD-10-CM | POA: Diagnosis not present

## 2021-07-19 DIAGNOSIS — E119 Type 2 diabetes mellitus without complications: Secondary | ICD-10-CM | POA: Insufficient documentation

## 2021-07-19 DIAGNOSIS — Z79899 Other long term (current) drug therapy: Secondary | ICD-10-CM | POA: Insufficient documentation

## 2021-07-19 DIAGNOSIS — F32A Depression, unspecified: Secondary | ICD-10-CM | POA: Diagnosis not present

## 2021-07-19 DIAGNOSIS — N390 Urinary tract infection, site not specified: Secondary | ICD-10-CM | POA: Insufficient documentation

## 2021-07-19 DIAGNOSIS — R509 Fever, unspecified: Secondary | ICD-10-CM | POA: Diagnosis present

## 2021-07-19 DIAGNOSIS — R5081 Fever presenting with conditions classified elsewhere: Secondary | ICD-10-CM

## 2021-07-19 DIAGNOSIS — R350 Frequency of micturition: Secondary | ICD-10-CM | POA: Diagnosis present

## 2021-07-19 DIAGNOSIS — E785 Hyperlipidemia, unspecified: Secondary | ICD-10-CM | POA: Diagnosis present

## 2021-07-19 LAB — CBC
HCT: 42 % (ref 39.0–52.0)
Hemoglobin: 13.2 g/dL (ref 13.0–17.0)
MCH: 28.8 pg (ref 26.0–34.0)
MCHC: 31.4 g/dL (ref 30.0–36.0)
MCV: 91.5 fL (ref 80.0–100.0)
Platelets: 266 10*3/uL (ref 150–400)
RBC: 4.59 MIL/uL (ref 4.22–5.81)
RDW: 13.4 % (ref 11.5–15.5)
WBC: 18.1 10*3/uL — ABNORMAL HIGH (ref 4.0–10.5)
nRBC: 0 % (ref 0.0–0.2)

## 2021-07-19 LAB — COMPREHENSIVE METABOLIC PANEL
ALT: 38 U/L (ref 0–44)
AST: 37 U/L (ref 15–41)
Albumin: 3.9 g/dL (ref 3.5–5.0)
Alkaline Phosphatase: 95 U/L (ref 38–126)
Anion gap: 8 (ref 5–15)
BUN: 24 mg/dL — ABNORMAL HIGH (ref 6–20)
CO2: 24 mmol/L (ref 22–32)
Calcium: 8.7 mg/dL — ABNORMAL LOW (ref 8.9–10.3)
Chloride: 103 mmol/L (ref 98–111)
Creatinine, Ser: 1.13 mg/dL (ref 0.61–1.24)
GFR, Estimated: >60 mL/min (ref >60)
Glucose, Bld: 112 mg/dL — ABNORMAL HIGH (ref 70–99)
Potassium: 4.2 mmol/L (ref 3.5–5.1)
Sodium: 135 mmol/L (ref 135–145)
Total Bilirubin: 1.1 mg/dL (ref 0.3–1.2)
Total Protein: 8.8 g/dL — ABNORMAL HIGH (ref 6.5–8.1)

## 2021-07-19 LAB — URINALYSIS, ROUTINE W REFLEX MICROSCOPIC
Bilirubin Urine: NEGATIVE
Glucose, UA: NEGATIVE mg/dL
Ketones, ur: 5 mg/dL — AB
Nitrite: NEGATIVE
Protein, ur: 100 mg/dL — AB
RBC / HPF: >50 RBC/hpf — ABNORMAL HIGH (ref 0–5)
Specific Gravity, Urine: 1.032 — ABNORMAL HIGH (ref 1.005–1.030)
WBC, UA: >50 WBC/hpf — ABNORMAL HIGH (ref 0–5)
pH: 5 (ref 5.0–8.0)

## 2021-07-19 LAB — LACTIC ACID, PLASMA
Lactic Acid, Venous: 1.2 mmol/L (ref 0.5–1.9)
Lactic Acid, Venous: 1.2 mmol/L (ref 0.5–1.9)

## 2021-07-19 LAB — SARS CORONAVIRUS 2 BY RT PCR: SARS Coronavirus 2 by RT PCR: NEGATIVE

## 2021-07-19 LAB — PROCALCITONIN: Procalcitonin: 0.29 ng/mL

## 2021-07-19 MED ORDER — DIPHENHYDRAMINE HCL 25 MG PO CAPS: 25.0000 mg | ORAL_CAPSULE | Freq: Four times a day (QID) | ORAL | Status: DC | PRN

## 2021-07-19 MED ORDER — ENOXAPARIN SODIUM 60 MG/0.6ML IJ SOSY
0.5000 mg/kg | PREFILLED_SYRINGE | Freq: Every day | INTRAMUSCULAR | Status: DC
  Administered 2021-07-19: 62.5 mg via SUBCUTANEOUS
  Filled 2021-07-19: qty 1.2

## 2021-07-19 MED ORDER — BEMPEDOIC ACID-EZETIMIBE 180-10 MG PO TABS: 1.0000 | ORAL_TABLET | Freq: Every day | ORAL | Status: DC

## 2021-07-19 MED ORDER — VITAMIN D 25 MCG (1000 UNIT) PO TABS
1000.0000 [IU] | ORAL_TABLET | Freq: Every day | ORAL | Status: DC
  Administered 2021-07-20: 1000 [IU] via ORAL
  Filled 2021-07-19: qty 1

## 2021-07-19 MED ORDER — PANTOPRAZOLE SODIUM 40 MG PO TBEC
40.0000 mg | DELAYED_RELEASE_TABLET | Freq: Every day | ORAL | Status: DC
  Administered 2021-07-19 – 2021-07-20 (×2): 40 mg via ORAL
  Filled 2021-07-19 (×2): qty 1

## 2021-07-19 MED ORDER — EZETIMIBE 10 MG PO TABS
10.0000 mg | ORAL_TABLET | Freq: Every day | ORAL | Status: DC
  Administered 2021-07-20: 10 mg via ORAL
  Filled 2021-07-19: qty 1

## 2021-07-19 MED ORDER — SIMVASTATIN 20 MG PO TABS
40.0000 mg | ORAL_TABLET | Freq: Every day | ORAL | Status: DC
  Filled 2021-07-19: qty 2

## 2021-07-19 MED ORDER — SODIUM CHLORIDE 0.9 % IV BOLUS (SEPSIS)
1000.0000 mL | Freq: Once | INTRAVENOUS | Status: AC
  Administered 2021-07-19: 1000 mL via INTRAVENOUS

## 2021-07-19 MED ORDER — POLYETHYLENE GLYCOL 3350 17 G PO PACK
17.0000 g | PACK | Freq: Every day | ORAL | Status: DC | PRN
  Administered 2021-07-20: 17 g via ORAL
  Filled 2021-07-19: qty 1

## 2021-07-19 MED ORDER — SODIUM CHLORIDE 0.9 % IV SOLN: INTRAVENOUS | Status: AC

## 2021-07-19 MED ORDER — ACETAMINOPHEN 650 MG RE SUPP: 650.0000 mg | Freq: Four times a day (QID) | RECTAL | Status: DC | PRN

## 2021-07-19 MED ORDER — TAMSULOSIN HCL 0.4 MG PO CAPS
0.4000 mg | ORAL_CAPSULE | Freq: Once | ORAL | Status: AC
  Administered 2021-07-19: 0.4 mg via ORAL
  Filled 2021-07-19: qty 1

## 2021-07-19 MED ORDER — IBUPROFEN 400 MG PO TABS: 600.0000 mg | ORAL_TABLET | ORAL | Status: DC | PRN

## 2021-07-19 MED ORDER — LACTATED RINGERS IV BOLUS
1000.0000 mL | Freq: Once | INTRAVENOUS | Status: AC
  Administered 2021-07-19: 1000 mL via INTRAVENOUS

## 2021-07-19 MED ORDER — SODIUM CHLORIDE 0.9 % IV SOLN
2.0000 g | INTRAVENOUS | Status: DC
  Administered 2021-07-19 – 2021-07-20 (×2): 2 g via INTRAVENOUS
  Filled 2021-07-19: qty 2

## 2021-07-19 MED ORDER — ONDANSETRON HCL 4 MG PO TABS: 4.0000 mg | ORAL_TABLET | Freq: Four times a day (QID) | ORAL | Status: DC | PRN

## 2021-07-19 MED ORDER — ACETAMINOPHEN 325 MG PO TABS
650.0000 mg | ORAL_TABLET | Freq: Four times a day (QID) | ORAL | Status: DC | PRN
  Administered 2021-07-19: 650 mg via ORAL
  Filled 2021-07-19: qty 2

## 2021-07-19 MED ORDER — ONDANSETRON HCL 4 MG/2ML IJ SOLN: 4.0000 mg | Freq: Four times a day (QID) | INTRAMUSCULAR | Status: DC | PRN

## 2021-07-19 MED ORDER — FAMOTIDINE 20 MG PO TABS
40.0000 mg | ORAL_TABLET | Freq: Every day | ORAL | Status: DC
  Administered 2021-07-20: 40 mg via ORAL
  Filled 2021-07-19: qty 2

## 2021-07-19 MED ORDER — PAROXETINE HCL 20 MG PO TABS
20.0000 mg | ORAL_TABLET | Freq: Every day | ORAL | Status: DC
  Administered 2021-07-20: 20 mg via ORAL
  Filled 2021-07-19 (×2): qty 1

## 2021-07-19 MED ORDER — SODIUM CHLORIDE 0.9 % IV SOLN: 2.0000 g | INTRAVENOUS | Status: DC

## 2021-07-19 MED ORDER — FOLIC ACID 1 MG PO TABS
1.0000 mg | ORAL_TABLET | Freq: Every day | ORAL | Status: DC
  Administered 2021-07-20: 1 mg via ORAL
  Filled 2021-07-19: qty 1

## 2021-07-19 MED ORDER — ACETAMINOPHEN 325 MG PO TABS: 650.0000 mg | ORAL_TABLET | Freq: Once | ORAL | Status: DC

## 2021-07-19 MED ORDER — HYDRALAZINE HCL 20 MG/ML IJ SOLN: 5.0000 mg | Freq: Four times a day (QID) | INTRAMUSCULAR | Status: DC | PRN

## 2021-07-19 NOTE — Sepsis Progress Note (Signed)
eLink monitoring code sepsis.  

## 2021-07-19 NOTE — ED Triage Notes (Signed)
Pt in with co urinary incontinence since yesteray states no hx of the same. Does have burning on urination.

## 2021-07-19 NOTE — ED Provider Notes (Signed)
[facility] Provider Note    Event Date/Time   First MD Initiated Contact with Patient 07/19/21 1045     (approximate)   History   Chief Complaint Urinary Frequency   HPI  Colin Simpson is a 56 y.o. male with past medical history of hypertension, diabetes, and COPD who presents to the ED complaining of difficulty urinating.  Patient reports that for the past 24 hours he has been unable to urinate on his own, states that when he does pass urine it leaks out and he is unable to control it.  He states there is some bubbling when he urinates and it also burns.  He feels like he has had difficulty emptying his bladder but he denies any abdominal pain, nausea, or vomiting.  He had not noticed any fevers at home, and does endorse a cough but denies any chest pain or shortness of breath.  He denies any history of kidney stones.     Physical Exam   Triage Vital Signs: ED Triage Vitals  Enc Vitals Group     BP 07/19/21 1013 (!) 107/48     Pulse Rate 07/19/21 1013 (!) 102     Resp 07/19/21 1013 20     Temp 07/19/21 1013 (!) 102.7 F (39.3 C)     Temp Source 07/19/21 1013 Oral     SpO2 07/19/21 1013 94 %     Weight 07/19/21 1014 275 lb (124.7 kg)     Height 07/19/21 1014 5\' 10"  (1.778 m)     Head Circumference --      Peak Flow --      Pain Score 07/19/21 1013 0     Pain Loc --      Pain Edu? --      Excl. in GC? --     Most recent vital signs: Vitals:   07/19/21 1013 07/19/21 1144  BP: (!) 107/48   Pulse: (!) 102   Resp: 20   Temp: (!) 102.7 F (39.3 C) 99.3 F (37.4 C)  SpO2: 94%     Constitutional: Alert and oriented. Eyes: Conjunctivae are normal. Head: Atraumatic. Nose: No congestion/rhinnorhea. Mouth/Throat: Mucous membranes are moist.  Cardiovascular: Normal rate, regular rhythm. Grossly normal heart sounds.  2+ radial pulses bilaterally. Respiratory: Normal respiratory effort.  No retractions. Lungs CTAB. Gastrointestinal: Soft  and nontender. No distention. Musculoskeletal: No lower extremity tenderness nor edema.  Neurologic:  Normal speech and language. No gross focal neurologic deficits are appreciated.    ED Results / Procedures / Treatments   Labs (all labs ordered are listed, but only abnormal results are displayed) Labs Reviewed  CBC - Abnormal; Notable for the following components:      Result Value   WBC 18.1 (*)    All other components within normal limits  COMPREHENSIVE METABOLIC PANEL - Abnormal; Notable for the following components:   Glucose, Bld 112 (*)    BUN 24 (*)    Calcium 8.7 (*)    Total Protein 8.8 (*)    All other components within normal limits  CULTURE, BLOOD (ROUTINE X 2)  CULTURE, BLOOD (ROUTINE X 2)  LACTIC ACID, PLASMA  LACTIC ACID, PLASMA  URINALYSIS, ROUTINE W REFLEX MICROSCOPIC   RADIOLOGY CT renal protocol reviewed and interpreted by me with no inflammatory changes, dilated bowel loops, or focal fluid collections.  PROCEDURES:  Critical Care performed: Yes, see critical care procedure note(s)  .Critical Care Performed by: 09/18/21, MD Authorized by: [name],  [name], MD   Critical care provider statement:    Critical care time (minutes):  30   Critical care time was exclusive of:  Separately billable procedures and treating other patients and teaching time   Critical care was necessary to treat or prevent imminent or life-threatening deterioration of the following conditions:  Sepsis   Critical care was time spent personally by me on the following activities:  Development of treatment plan with patient or surrogate, discussions with consultants, evaluation of patient's response to treatment, examination of patient, ordering and review of laboratory studies, ordering and review of radiographic studies, ordering and performing treatments and interventions, pulse oximetry, re-evaluation of patient's condition and review of old charts   I assumed direction of  critical care for this patient from another provider in my specialty: no     Care discussed with: admitting provider     MEDICATIONS ORDERED IN ED: Medications  acetaminophen (TYLENOL) tablet 650 mg (650 mg Oral Not Given 07/19/21 1144)  cefTRIAXone (ROCEPHIN) 2 g in sodium chloride 0.9 % 100 mL IVPB (2 g Intravenous New Bag/Given 07/19/21 1128)  lactated ringers bolus 1,000 mL (1,000 mLs Intravenous New Bag/Given 07/19/21 1128)     IMPRESSION / MDM / ASSESSMENT AND PLAN / ED COURSE  I reviewed the triage vital signs and the nursing notes.                              56 y.o. male with past medical history of hypertension, diabetes, and COPD who presents to the ED complaining of burning while he urinates and urinary incontinence for the past 24 hours, noted to be febrile here in the ED.  Patient's presentation is most consistent with acute presentation with potential threat to life or bodily function.  Differential diagnosis includes, but is not limited to, sepsis, UTI, pneumonia, urinary retention, AKI, electrolyte abnormality.  Patient noted to be febrile and tachycardic on arrival to the ED, concerning for sepsis with likely source being UTI.  CT renal protocol is reassuring with no evidence of kidney stone or other acute finding.  He also endorses a cough and we will screen chest x-ray for potential infectious source.  Labs remarkable for leukocytosis consistent with sepsis, no AKI or electrolyte abnormality noted.  Blood cultures and lactic acid are pending, we will start patient on IV antibiotics due to concern for sepsis.  Plan to check bladder scan to determine need for Foley catheter placement.  Bladder scan does not show greater than 10 cc of urine at this time, we will continue IV fluid hydration and patient would benefit from male PureWick placement given his incontinence.  No indication for Foley catheter at this time.  Case discussed with hospitalist for admission for further  management of sepsis presumed secondary to UTI.      FINAL CLINICAL IMPRESSION(S) / ED DIAGNOSES   Final diagnoses:  Sepsis without acute organ dysfunction, due to unspecified organism [facility])  Urinary tract infection without hematuria, site unspecified     Rx / DC Orders   ED Discharge Orders     None        Note:  This document was prepared using Dragon voice recognition software and may include unintentional dictation errors.   [name], MD 07/19/21 1233

## 2021-07-19 NOTE — ED Notes (Addendum)
Pt temp 101, prn tylenol given. Dr. Sedalia Muta notified

## 2021-07-19 NOTE — Assessment & Plan Note (Signed)
-   Presumed secondary to sepsis with presumptive diagnosis of UTI - Acetaminophen 650 mg, p.o. and rectal as needed every 6 hours for fever, headache, mild pain ordered

## 2021-07-19 NOTE — Assessment & Plan Note (Signed)
-   Presumed secondary to sepsis secondary to UTI - Treat as above

## 2021-07-19 NOTE — Assessment & Plan Note (Signed)
-   Presumptive diagnosis of sepsis secondary to UTI given patient's chief concern of dysuria, and urinary incontinence - However pneumonia cannot be excluded as patient also endorsed cough - Check procalcitonin, UA ordered and pending collection, blood cultures x2 have been collected and are in process - Continue with ceftriaxone 2 g IV per EDP order - Status post LR 1 L bolus - Ordered additional sodium chloride 1 L bolus followed by sodium chloride IVF at 150 mL/h, 15 hours ordered - Admit to telemetry medical, observation

## 2021-07-19 NOTE — ED Provider Triage Note (Signed)
Emergency Medicine Provider Triage Evaluation Note  Christine Salena Saner Manwarren , a 56 y.o. male  was evaluated in triage.  Pt complains of fever and dysruia   Review of Systems  Positive: Fever, dysuria  Negative: no back pain or abdominal pain   Physical Exam  BP (!) 107/48 (BP Location: Left Arm)   Pulse (!) 102   Temp (!) 102.7 F (39.3 C) (Oral)   Resp 20   Ht 5\' 10"  (1.778 m)   Wt 124.7 kg   SpO2 94%   BMI 39.46 kg/m  Gen:   Awake, no distress   Resp:  Normal effort  MSK:   Moves extremities ithout difficulty  Other:    Medical Decision Making  Medically screening exam initiated at 10:17 AM.  Appropriate orders placed.  Ashley C Gal was informed that the remainder of the evaluation will be completed by another provider, this initial triage assessment does not replace that evaluation, and the importance of remaining in the ED until their evaluation is complete.  Labs, tylenol    , MD 07/19/21 1017

## 2021-07-19 NOTE — Assessment & Plan Note (Signed)
-   Status post condom catheter - Strict I's and O's

## 2021-07-19 NOTE — H&P (Signed)
History and Physical   Colin Simpson AOZ:[id number] DOB: 1965/08/05 DOA: 07/19/2021  PCP: Armando Gang, FNP  Patient coming from: Home  I have personally briefly reviewed patient's old medical records in [facility] EMR.  Chief Concern: Dysuria, urinary incontinence, cough  HPI: Mr. Colin Simpson is a 56 year old male with history of depression, GERD, hyperlipidemia, who presents to the emergency department for chief concerns of dysuria, cough, and urinary incontinence.  Initial vitals in the emergency department showed Tmax of 102.7, improved to 99.3, respiration rate of 20, heart rate of 102, blood pressure 107/48, SPO2 of 94% on room air.  Serum sodium is 135, potassium 4.2, chloride 103, bicarb 24, BUN of 24, serum creatinine of 1.13, GFR greater than 60, nonfasting blood glucose 112, WBC 18.1, hemoglobin 13.2, platelets of 266.  Lactic acid was 1.2x2 collections.  Blood cultures are in process.  UA ordered and needs collection.  CT renal stone study: Was read as no acute findings to explain the patient's clinical history.  Hepatomegaly.  Small borderline enlarged pelvic and inguinal lymph nodes, possibly reactive.  ED treatment: Ceftriaxone 2 g IV daily, 7 days ordered; LR 1 L bolus, acetaminophen 650 mg p.o. one-time dose.  At bedside patient is able to tell me his name, his age, he knows he is in the hospital and he is able to identify his sister is at bedside  He reports that since Saturday, 07/17/2021, he is had decreased urine output.  He states that he urinated minimally 1 time yesterday and minimally 1 time today.  He also endorses poor p.o. intake over the last 2 days.  He denies any fever, vision changes, dysphagia, chest pain, shortness of breath, abdominal pain, diarrhea, swelling in his lower extremities, syncope, loss of consciousness.  He endorses nausea and vomiting x1 episode prior to ED presentation.  He states the vomitus was clear sputum/saliva.  He  endorses chills over the last 2 days.  He denies any changes to his diet.  Other than poor p.o. intake.  He reports these urinary symptoms has never happened before.  Social history: He lives at home with his sisters.  He denies smoking cigarettes.  He endorses daily chewing tobacco, 1 pack/day.  He denies EtOH and recreational drug use.  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  ROS: Constitutional: no weight change, no fever ENT/Mouth: no sore throat, no rhinorrhea Eyes: no eye pain, no vision changes Cardiovascular: no chest pain, no dyspnea,  no edema, no palpitations Respiratory: no cough, no sputum, no wheezing Gastrointestinal: no nausea, no vomiting, no diarrhea, no constipation Genitourinary: + urinary incontinence, + dysuria, no hematuria, suprapubic tenderness with urination Musculoskeletal: no arthralgias, no myalgias Skin: no skin lesions, no pruritus, Neuro: + weakness, no loss of consciousness, no syncope Psych: no anxiety, no depression, + decrease appetite Heme/Lymph: no bruising, no bleeding  ED Course: Discussed with emergency medicine provider, patient requiring hospitalization for chief concerns of meeting sepsis criteria.  Assessment/Plan  Principal Problem:   Sepsis (HCC) Active Problems:   Hyperlipidemia   Depression   Fever   Leukocytosis   Essential hypertension   Decreased urine output   Assessment and Plan:  * Sepsis (HCC) - Presumptive diagnosis of sepsis secondary to UTI given patient's chief concern of dysuria, and urinary incontinence - However pneumonia cannot be excluded as patient also endorsed cough - Check procalcitonin, UA ordered and pending collection, blood cultures x2 have been collected and are in process - Continue with ceftriaxone 2 g  IV per EDP order - Status post LR 1 L bolus - Ordered additional sodium chloride 1 L bolus followed by sodium chloride IVF at 150 mL/h, 15 hours ordered - Admit to telemetry medical,  observation  Decreased urine output - Status post condom catheter - Strict I's and O's  Essential hypertension - Holding home lisinopril 40 mg daily at this time due to low normotensive presentation with presumptive diagnosis of sepsis secondary to UTI - Hydralazine 5 mg IV every 6 hours as needed for SBP greater than 175, 3 days ordered - AM team to resume home antihypertensive medications when the benefits outweigh the risk  Leukocytosis - Presumed secondary to sepsis secondary to UTI - Treat as above  Fever - Presumed secondary to sepsis with presumptive diagnosis of UTI - Acetaminophen 650 mg, p.o. and rectal as needed every 6 hours for fever, headache, mild pain ordered  Pending med reconciliation  Chart reviewed.   DVT prophylaxis: Enoxaparin Code Status: Full code Diet: Heart healthy Family Communication: Sisters, Diane and Eunice BlaseDebbie were updated at bedside with patient's permission Disposition Plan: Pending clinical course Consults called: None at this time Admission status: Telemetry medical, observation  Past Medical History:  Diagnosis Date   Diabetes mellitus without complication (HCC)    Head injury    Hypertension    PTSD (post-traumatic stress disorder)    Past Surgical History:  Procedure Laterality Date   head surgery     Social History:  reports that he has quit smoking. His smokeless tobacco use includes chew. He reports that he does not drink alcohol and does not use drugs.  No Known Allergies Family History  Problem Relation Age of Onset   Diabetes Mother    Diabetes Father    Family history: Family history reviewed and not pertinent.  Prior to Admission medications   Medication Sig Start Date End Date Taking? Authorizing Provider  cholecalciferol (VITAMIN D) 400 units TABS tablet Take 400 Units by mouth daily.    [provider]  diphenhydrAMINE (BENADRYL) 25 mg capsule Take 1 capsule (25 mg total) by mouth every 6 (six) hours as  needed for itching, allergies or sleep. 10/15/17   Shaune Pollackhen, Qing, MD  EPINEPHrine 0.3 mg/0.3 mL IJ SOAJ injection Inject 0.3 mLs (0.3 mg total) into the muscle as needed (for hypersensitivity from wasp sting). 10/15/17   Shaune Pollackhen, Qing, MD  pantoprazole (PROTONIX) 40 MG tablet Take 40 mg by mouth daily. 10/03/17   [provider]  PARoxetine (PAXIL) 10 MG tablet Take 10 mg by mouth daily. 10/03/17   [provider]  predniSONE (DELTASONE) 20 MG tablet Take 2 tablets (40 mg total) by mouth daily with breakfast. 10/15/17   Shaune Pollackhen, Qing, MD  simvastatin (ZOCOR) 40 MG tablet Take 40 mg by mouth daily at 6 PM. 10/03/17   [provider]  terbinafine (LAMISIL) 250 MG tablet Take 250 mg by mouth daily. 30 tablets dispensed; no refills 10/03/17   [provider]   Physical Exam: Vitals:   07/19/21 1013 07/19/21 1014 07/19/21 1144  BP: (!) 107/48    Pulse: (!) 102    Resp: 20    Temp: (!) 102.7 F (39.3 C)  99.3 F (37.4 C)  TempSrc: Oral  Oral  SpO2: 94%    Weight:  124.7 kg   Height:  5\' 10"  (1.778 m)    Constitutional: appears older than chronological age, NAD, calm, comfortable Eyes: PERRL, lids and conjunctivae normal ENMT: Mucous membranes are moist. Posterior pharynx  clear of any exudate or lesions. Age-appropriate dentition. Hearing appropriate Neck: normal, supple, no masses, no thyromegaly Respiratory: clear to auscultation bilaterally, no wheezing, no crackles. Normal respiratory effort. No accessory muscle use.  Cardiovascular: Regular rate and rhythm, no murmurs / rubs / gallops. No extremity edema. 2+ pedal pulses. No carotid bruits.  Abdomen: Mild suprapubic tenderness with deep palpation, morbidly obese abdomen, no masses palpated, no hepatosplenomegaly. Bowel sounds positive.  Musculoskeletal: no clubbing / cyanosis. No joint deformity upper and lower extremities. Good ROM, no contractures, no atrophy. Normal muscle tone.  Skin: no rashes, lesions, ulcers. No  induration.  Bilateral lower extremity skin rashes appear secondary to bug bites. Neurologic: Sensation intact. Strength 5/5 in all 4.  Psychiatric: Normal judgment and insight. Alert and oriented x 3. Normal mood.   EKG: Not indicated at this time  Chest x-ray on Admission: I personally reviewed and I agree with radiologist reading as below.  DG Chest 2 View  Result Date: 07/19/2021 CLINICAL DATA:  56 year old male with cough and fever. EXAM: CHEST - 2 VIEW COMPARISON:  10/13/2016 FINDINGS: The mediastinal contours are within normal limits. No cardiomegaly. Similar appearing elevation the right hemidiaphragm. The lungs are clear bilaterally without evidence of focal consolidation, pleural effusion, or pneumothorax. No acute osseous abnormality. IMPRESSION: No acute cardiopulmonary process. Electronically Signed   By: Marliss Coots M.D.   On: 07/19/2021 11:27   CT Renal Stone Study  Result Date: 07/19/2021 CLINICAL DATA:  Urinary incontinence, flank pain. EXAM: CT ABDOMEN AND PELVIS WITHOUT CONTRAST TECHNIQUE: Multidetector CT imaging of the abdomen and pelvis was performed following the standard protocol without IV contrast. RADIATION DOSE REDUCTION: This exam was performed according to the departmental dose-optimization program which includes automated exposure control, adjustment of the mA and/or kV according to patient size and/or use of iterative reconstruction technique. COMPARISON:  None. FINDINGS: Lower chest: Lung bases are clear. Heart is at the upper limits of normal in size to mildly enlarged. No pericardial or pleural effusion. Distal esophagus is grossly unremarkable. Hepatobiliary: Liver is enlarged, 21.2 cm. Liver and gallbladder are otherwise unremarkable. No biliary ductal dilatation. Pancreas: Negative. Spleen: Negative. Adrenals/Urinary Tract: Adrenal glands and kidneys are unremarkable. No urinary stones. Ureters are decompressed. Bladder is low in volume. Stomach/Bowel: Stomach,  small bowel, appendix and colon are unremarkable. Vascular/Lymphatic: Vascular structures are unremarkable. No abdominal peritoneal ligament, abdominal retroperitoneal or mesenteric adenopathy. Inguinal lymph nodes measure up to 1.6 cm on the left. Iliac chain lymph nodes more prominent on the left, measuring up to 11 mm in the left external iliac chain. Reproductive: Prostate is normal in size. Other: Small bilateral inguinal hernias contain fat. No free fluid. Mesenteries and peritoneum are unremarkable. Musculoskeletal: Degenerative changes in the spine. No worrisome lytic or sclerotic lesions. IMPRESSION: 1. No acute findings to explain the patient's clinical history. 2. Hepatomegaly. 3. Small to borderline enlarged pelvic and inguinal lymph nodes, possibly reactive. Difficult to exclude a lymphoproliferative disorder. Electronically Signed   By: Leanna Battles M.D.   On: 07/19/2021 10:36    Labs on Admission: I have personally reviewed following labs  CBC: Recent Labs  Lab 07/19/21 1016  WBC 18.1*  HGB 13.2  HCT 42.0  MCV 91.5  PLT 266   Basic Metabolic Panel: Recent Labs  Lab 07/19/21 1016  NA 135  K 4.2  CL 103  CO2 24  GLUCOSE 112*  BUN 24*  CREATININE 1.13  CALCIUM 8.7*   GFR: Estimated Creatinine Clearance: 97.9 mL/min (by C-G  formula based on SCr of 1.13 mg/dL).  Liver Function Tests: Recent Labs  Lab 07/19/21 1016  AST 37  ALT 38  ALKPHOS 95  BILITOT 1.1  PROT 8.8*  ALBUMIN 3.9   Dr. Sedalia Muta [facility]  If 7PM-7AM, please contact overnight-coverage provider If 7AM-7PM, please contact day coverage provider [email or web address]  07/19/2021, 1:25 PM

## 2021-07-19 NOTE — Assessment & Plan Note (Addendum)
-   Holding home lisinopril 40 mg daily at this time due to low normotensive presentation with presumptive diagnosis of sepsis secondary to UTI - Hydralazine 5 mg IV every 6 hours as needed for SBP greater than 175, 3 days ordered - AM team to resume home antihypertensive medications when the benefits outweigh the risk

## 2021-07-19 NOTE — Hospital Course (Addendum)
Mr. Colin Simpson is a 56 year old male with history of depression, GERD, hypertension, hyperlipidemia, who presents to the emergency department for chief concerns of dysuria, cough, and urinary incontinence.  Initial vitals in the emergency department showed Tmax of 102.7, improved to 99.3, respiration rate of 20, heart rate of 102, blood pressure 107/48, SPO2 of 94% on room air.  Serum sodium is 135, potassium 4.2, chloride 103, bicarb 24, BUN of 24, serum creatinine of 1.13, GFR greater than 60, nonfasting blood glucose 112, WBC 18.1, hemoglobin 13.2, platelets of 266.  Lactic acid was 1.2x2 collections.  Blood cultures are in process.  UA ordered and needs collection.  CT renal stone study: Was read as no acute findings to explain the patient's clinical history.  Hepatomegaly.  Small borderline enlarged pelvic and inguinal lymph nodes, possibly reactive.  ED treatment: Ceftriaxone 2 g IV daily, 7 days ordered; LR 1 L bolus, acetaminophen 650 mg p.o. one-time dose.

## 2021-07-20 DIAGNOSIS — R34 Anuria and oliguria: Secondary | ICD-10-CM | POA: Diagnosis not present

## 2021-07-20 DIAGNOSIS — A419 Sepsis, unspecified organism: Secondary | ICD-10-CM | POA: Diagnosis not present

## 2021-07-20 DIAGNOSIS — I10 Essential (primary) hypertension: Secondary | ICD-10-CM | POA: Diagnosis not present

## 2021-07-20 LAB — CBC
HCT: 35.2 % — ABNORMAL LOW (ref 39.0–52.0)
Hemoglobin: 11.4 g/dL — ABNORMAL LOW (ref 13.0–17.0)
MCH: 29.2 pg (ref 26.0–34.0)
MCHC: 32.4 g/dL (ref 30.0–36.0)
MCV: 90 fL (ref 80.0–100.0)
Platelets: 215 10*3/uL (ref 150–400)
RBC: 3.91 MIL/uL — ABNORMAL LOW (ref 4.22–5.81)
RDW: 13.2 % (ref 11.5–15.5)
WBC: 12.5 10*3/uL — ABNORMAL HIGH (ref 4.0–10.5)
nRBC: 0 % (ref 0.0–0.2)

## 2021-07-20 LAB — BASIC METABOLIC PANEL
Anion gap: 7 (ref 5–15)
BUN: 16 mg/dL (ref 6–20)
CO2: 24 mmol/L (ref 22–32)
Calcium: 8.2 mg/dL — ABNORMAL LOW (ref 8.9–10.3)
Chloride: 104 mmol/L (ref 98–111)
Creatinine, Ser: 1.09 mg/dL (ref 0.61–1.24)
GFR, Estimated: >60 mL/min (ref >60)
Glucose, Bld: 107 mg/dL — ABNORMAL HIGH (ref 70–99)
Potassium: 3.9 mmol/L (ref 3.5–5.1)
Sodium: 135 mmol/L (ref 135–145)

## 2021-07-20 LAB — CORTISOL-AM, BLOOD: Cortisol - AM: 15 ug/dL (ref 6.7–22.6)

## 2021-07-20 LAB — PROTIME-INR
INR: 1.3 — ABNORMAL HIGH (ref 0.8–1.2)
Prothrombin Time: 15.7 seconds — ABNORMAL HIGH (ref 11.4–15.2)

## 2021-07-20 MED ORDER — TAMSULOSIN HCL 0.4 MG PO CAPS: 0.4000 mg | ORAL_CAPSULE | Freq: Every day | ORAL | 1 refills | Status: AC

## 2021-07-20 MED ORDER — DIPHENHYDRAMINE HCL 25 MG PO CAPS: 25.0000 mg | ORAL_CAPSULE | Freq: Four times a day (QID) | ORAL | 0 refills | Status: AC | PRN

## 2021-07-20 MED ORDER — TAMSULOSIN HCL 0.4 MG PO CAPS: 0.4000 mg | ORAL_CAPSULE | Freq: Every day | ORAL | Status: DC

## 2021-07-20 MED ORDER — SULFAMETHOXAZOLE-TRIMETHOPRIM 800-160 MG PO TABS
1.0000 | ORAL_TABLET | Freq: Two times a day (BID) | ORAL | 0 refills | Status: AC
Start: 2021-07-20 — End: 2021-07-27

## 2021-07-20 MED ORDER — SODIUM CHLORIDE 0.9 % IV SOLN: INTRAVENOUS | Status: DC

## 2021-07-20 NOTE — Plan of Care (Signed)

## 2021-07-20 NOTE — Progress Notes (Signed)
  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 We will continue to monitor patient advancement through interdisciplinary progression rounds. If new patient transition needs arise, please place a TOC consult.

## 2021-07-20 NOTE — Discharge Summary (Signed)
Physician Discharge Summary   Patient: Colin Simpson MRN: [id number] DOB: 15-Jul-1965  Admit date:     07/19/2021  Discharge date: 07/20/21  Discharge Physician: Lorella Nimrod   PCP: Remi Haggard, FNP   Recommendations at discharge:  Please follow-up on urine culture results Follow-up with primary care provider within a week Follow-up with urology  Discharge Diagnoses: Principal Problem:   Sepsis ([place]) Active Problems:   Hyperlipidemia   Depression   Fever   Leukocytosis   Essential hypertension   Decreased urine output   Hospital Course: Mr. Colin Simpson is a 56 year old male with history of depression, GERD, hypertension, hyperlipidemia, who presents to the emergency department for chief concerns of dysuria, cough, and urinary incontinence.  Symptoms started yesterday with poor p.o. intake and decreased urinary output.  Review of systems was also positive for nausea, vomiting x1 and some chills.  No recorded fever at home.  Initial vitals in the emergency department showed Tmax of 102.7, improved to 99.3, respiration rate of 20, heart rate of 102, blood pressure 107/48, SPO2 of 94% on room air.  Serum sodium is 135, potassium 4.2, chloride 103, bicarb 24, BUN of 24, serum creatinine of 1.13, GFR greater than 60, nonfasting blood glucose 112, WBC 18.1, hemoglobin 13.2, platelets of 266.  Lactic acid was 1.2x2 collections.  Blood cultures are in process.  UA ordered and needs collection.  CT renal stone study: Was read as no acute findings to explain the patient's clinical history.  Hepatomegaly.  Small borderline enlarged pelvic and inguinal lymph nodes, possibly reactive.  ED treatment: Ceftriaxone 2 g IV daily, 7 days ordered; LR 1 L bolus, acetaminophen 650 mg p.o. one-time dose.  Patient met sepsis criteria with fever and leukocytosis.  Most likely secondary to UTI.  6/6: Leukocytosis started improving, at 12.5, most likely some dilutional element as all  cell lines decreased and he received IV fluid.  Mildly elevated INR at 1.3, procalcitonin at 0.29, blood cultures x2 remain negative in 24 hours, UA with hematuria and large leukocytes, urine cultures pending. Patient was started on ceftriaxone. Patient remained stable and would like to go home.  He is being discharged on 1 week of Bactrim.  Also started him on Flomax for concern of having difficulty starting urination.  Patient need to follow-up with urology as an outpatient for further recommendations.  He will continue with his current medications and follow-up with his providers.  Assessment and Plan: * Sepsis ([place]) - Presumptive diagnosis of sepsis secondary to UTI given patient's chief concern of dysuria, and urinary incontinence - However pneumonia cannot be excluded as patient also endorsed cough - Check procalcitonin, UA ordered and pending collection, blood cultures x2 have been collected and are in process - Continue with ceftriaxone 2 g IV per EDP order - Status post LR 1 L bolus - Ordered additional sodium chloride 1 L bolus followed by sodium chloride IVF at 150 mL/h, 15 hours ordered - Admit to telemetry medical, observation  Decreased urine output - Status post condom catheter - Strict I's and O's  Essential hypertension - Holding home lisinopril 40 mg daily at this time due to low normotensive presentation with presumptive diagnosis of sepsis secondary to UTI - Hydralazine 5 mg IV every 6 hours as needed for SBP greater than 175, 3 days ordered - AM team to resume home antihypertensive medications when the benefits outweigh the risk  Leukocytosis - Presumed secondary to sepsis secondary to UTI - Treat as above  Fever -  Presumed secondary to sepsis with presumptive diagnosis of UTI - Acetaminophen 650 mg, p.o. and rectal as needed every 6 hours for fever, headache, mild pain ordered   Consultants: None Procedures performed: None Disposition: Home Diet  recommendation:  Discharge Diet Orders (From admission, onward)     Start     Ordered   07/20/21 0000  Diet - low sodium heart healthy        07/20/21 1518           Cardiac diet DISCHARGE MEDICATION: Allergies as of 07/20/2021   No Known Allergies      Medication List     TAKE these medications    cetirizine 10 MG tablet Commonly known as: ZYRTEC Take 10 mg by mouth daily.   cholecalciferol 10 MCG (400 UNIT) Tabs tablet Commonly known as: VITAMIN D3 Take 400 Units by mouth daily.   diphenhydrAMINE 25 mg capsule Commonly known as: BENADRYL Take 1 capsule (25 mg total) by mouth every 6 (six) hours as needed for itching, allergies or sleep.   EPINEPHrine 0.3 mg/0.3 mL Soaj injection Commonly known as: EPI-PEN Inject 0.3 mLs (0.3 mg total) into the muscle as needed (for hypersensitivity from wasp sting).   famotidine 40 MG tablet Commonly known as: PEPCID Take 40 mg by mouth daily.   folic acid 1 MG tablet Commonly known as: FOLVITE Take 1 mg by mouth daily.   lisinopril 40 MG tablet Commonly known as: ZESTRIL Take 40 mg by mouth daily.   Nexlizet 180-10 MG Tabs Generic drug: Bempedoic Acid-Ezetimibe Take 1 tablet by mouth daily.   pantoprazole 40 MG tablet Commonly known as: PROTONIX Take 40 mg by mouth daily.   PARoxetine 20 MG tablet Commonly known as: PAXIL Take 20 mg by mouth daily.   simvastatin 40 MG tablet Commonly known as: ZOCOR Take 40 mg by mouth daily at 6 PM.   sulfamethoxazole-trimethoprim 800-160 MG tablet Commonly known as: BACTRIM DS Take 1 tablet by mouth 2 (two) times daily for 7 days.   tamsulosin 0.4 MG Caps capsule Commonly known as: FLOMAX Take 1 capsule (0.4 mg total) by mouth daily after supper.        Follow-up Information     Remi Haggard, FNP. Schedule an appointment as soon as possible for a visit in 1 week(s).   Specialty: Family Medicine Contact information: [address] [place]  [address] [phone]                Discharge Exam: Filed Weights   07/19/21 1014  Weight: 124.7 kg   General.     In no acute distress. Pulmonary.  Lungs clear bilaterally, normal respiratory effort. CV.  Regular rate and rhythm, no JVD, rub or murmur. Abdomen.  Soft, nontender, nondistended, BS positive. CNS.  Alert and oriented .  No focal neurologic deficit. Extremities.  No edema, no cyanosis, pulses intact and symmetrical. Psychiatry.  Judgment and insight appears normal.   Condition at discharge: stable  The results of significant diagnostics from this hospitalization (including imaging, microbiology, ancillary and laboratory) are listed below for reference.   Imaging Studies: DG Chest 2 View  Result Date: 07/19/2021 CLINICAL DATA:  56 year old male with cough and fever. EXAM: CHEST - 2 VIEW COMPARISON:  10/13/2016 FINDINGS: The mediastinal contours are within normal limits. No cardiomegaly. Similar appearing elevation the right hemidiaphragm. The lungs are clear bilaterally without evidence of focal consolidation, pleural effusion, or pneumothorax. No acute osseous abnormality. IMPRESSION: No acute cardiopulmonary process. Electronically  Signed   By: Ruthann Cancer M.D.   On: 07/19/2021 11:27   CT Renal Stone Study  Result Date: 07/19/2021 CLINICAL DATA:  Urinary incontinence, flank pain. EXAM: CT ABDOMEN AND PELVIS WITHOUT CONTRAST TECHNIQUE: Multidetector CT imaging of the abdomen and pelvis was performed following the standard protocol without IV contrast. RADIATION DOSE REDUCTION: This exam was performed according to the departmental dose-optimization program which includes automated exposure control, adjustment of the mA and/or kV according to patient size and/or use of iterative reconstruction technique. COMPARISON:  None. FINDINGS: Lower chest: Lung bases are clear. Heart is at the upper limits of normal in size to mildly enlarged. No pericardial or pleural effusion.  Distal esophagus is grossly unremarkable. Hepatobiliary: Liver is enlarged, 21.2 cm. Liver and gallbladder are otherwise unremarkable. No biliary ductal dilatation. Pancreas: Negative. Spleen: Negative. Adrenals/Urinary Tract: Adrenal glands and kidneys are unremarkable. No urinary stones. Ureters are decompressed. Bladder is low in volume. Stomach/Bowel: Stomach, small bowel, appendix and colon are unremarkable. Vascular/Lymphatic: Vascular structures are unremarkable. No abdominal peritoneal ligament, abdominal retroperitoneal or mesenteric adenopathy. Inguinal lymph nodes measure up to 1.6 cm on the left. Iliac chain lymph nodes more prominent on the left, measuring up to 11 mm in the left external iliac chain. Reproductive: Prostate is normal in size. Other: Small bilateral inguinal hernias contain fat. No free fluid. Mesenteries and peritoneum are unremarkable. Musculoskeletal: Degenerative changes in the spine. No worrisome lytic or sclerotic lesions. IMPRESSION: 1. No acute findings to explain the patient's clinical history. 2. Hepatomegaly. 3. Small to borderline enlarged pelvic and inguinal lymph nodes, possibly reactive. Difficult to exclude a lymphoproliferative disorder. Electronically Signed   By: Lorin Picket M.D.   On: 07/19/2021 10:36    Microbiology: Results for orders placed or performed during the hospital encounter of 07/19/21  Culture, blood (routine x 2)     Status: None (Preliminary result)   Collection Time: 07/19/21 11:20 AM   Specimen: BLOOD RIGHT ARM  Result Value Ref Range Status   Specimen Description BLOOD RIGHT ARM  Final   Special Requests   Final    BOTTLES DRAWN AEROBIC AND ANAEROBIC Blood Culture results may not be optimal due to an excessive volume of blood received in culture bottles   Culture   Final    NO GROWTH < 24 HOURS Performed at [facility], [address], [place], [place] [address]    Report Status PENDING  Incomplete  Culture, blood  (routine x 2)     Status: None (Preliminary result)   Collection Time: 07/19/21 11:25 AM   Specimen: BLOOD RIGHT HAND  Result Value Ref Range Status   Specimen Description BLOOD RIGHT HAND  Final   Special Requests   Final    BOTTLES DRAWN AEROBIC AND ANAEROBIC Blood Culture adequate volume   Culture   Final    NO GROWTH < 24 HOURS Performed at [facility], [address], [place], [place] [address]    Report Status PENDING  Incomplete  SARS Coronavirus 2 by RT PCR (hospital order, performed in [place] hospital lab) *cepheid single result test* Anterior Nasal Swab     Status: None   Collection Time: 07/19/21  1:38 PM   Specimen: Anterior Nasal Swab  Result Value Ref Range Status   SARS Coronavirus 2 by RT PCR NEGATIVE NEGATIVE Final    Comment: (NOTE) SARS-CoV-2 target nucleic acids are NOT DETECTED.  The SARS-CoV-2 RNA is generally detectable in upper and lower respiratory specimens during  the acute phase of infection. The lowest concentration of SARS-CoV-2 viral copies this assay can detect is 250 copies / mL. A negative result does not preclude SARS-CoV-2 infection and should not be used as the sole basis for treatment or other patient management decisions.  A negative result may occur with improper specimen collection / handling, submission of specimen other than nasopharyngeal swab, presence of viral mutation(s) within the areas targeted by this assay, and inadequate number of viral copies (<250 copies / mL). A negative result must be combined with clinical observations, patient history, and epidemiological information.  Fact Sheet for Patients:   https://www.patel.info/  Fact Sheet for Healthcare Providers: https://hall.com/  This test is not yet approved or  cleared by the [place] FDA and has been authorized for detection and/or diagnosis of SARS-CoV-2 by FDA under an Emergency Use Authorization (EUA).  This  EUA will remain in effect (meaning this test can be used) for the duration of the COVID-19 declaration under Section 564(b)(1) of the Act, 21 U.S.C. section 360bbb-3(b)(1), unless the authorization is terminated or revoked sooner.  Performed at [facility], [address] [place], [place] [address]     Labs: CBC: Recent Labs  Lab 07/19/21 1016 07/20/21 0513  WBC 18.1* 12.5*  HGB 13.2 11.4*  HCT 42.0 35.2*  MCV 91.5 90.0  PLT 266 158   Basic Metabolic Panel: Recent Labs  Lab 07/19/21 1016 07/20/21 0513  NA 135 135  K 4.2 3.9  CL 103 104  CO2 24 24  GLUCOSE 112* 107*  BUN 24* 16  CREATININE 1.13 1.09  CALCIUM 8.7* 8.2*   Liver Function Tests: Recent Labs  Lab 07/19/21 1016  AST 37  ALT 38  ALKPHOS 95  BILITOT 1.1  PROT 8.8*  ALBUMIN 3.9   CBG: No results for input(s): GLUCAP in the last 168 hours.  Discharge time spent: greater than 30 minutes.  This record has been created using Systems analyst. Errors have been sought and corrected,but may not always be located. Such creation errors do not reflect on the standard of care.   Signed: Lorella Nimrod, MD [facility] 07/20/2021

## 2021-07-20 NOTE — Progress Notes (Signed)
DISCHARGE NOTE:  Pt and sister given discharge instructions, and verbalized understanding. Pt wheeled to car by staff.

## 2021-07-21 LAB — URINE CULTURE

## 2021-07-21 LAB — HIV ANTIBODY (ROUTINE TESTING W REFLEX): HIV Screen 4th Generation wRfx: NONREACTIVE

## 2021-07-23 LAB — CULTURE, BLOOD (ROUTINE X 2): Culture: NO GROWTH

## 2021-07-25 LAB — CULTURE, BLOOD (ROUTINE X 2)
Culture: NO GROWTH
Special Requests: ADEQUATE

## 2021-12-27 ENCOUNTER — Emergency Department: Payer: Medicare HMO

## 2021-12-27 ENCOUNTER — Other Ambulatory Visit: Payer: Self-pay

## 2021-12-27 ENCOUNTER — Encounter: Payer: Self-pay | Admitting: Emergency Medicine

## 2021-12-27 ENCOUNTER — Emergency Department
Admission: EM | Admit: 2021-12-27 | Discharge: 2021-12-27 | Disposition: A | Discharge status: Home / Self Care | Payer: Medicare HMO | Attending: Emergency Medicine | Admitting: Emergency Medicine

## 2021-12-27 DIAGNOSIS — W14XXXA Fall from tree, initial encounter: Secondary | ICD-10-CM | POA: Diagnosis not present

## 2021-12-27 DIAGNOSIS — S8992XA Unspecified injury of left lower leg, initial encounter: Secondary | ICD-10-CM | POA: Diagnosis present

## 2021-12-27 DIAGNOSIS — S8002XA Contusion of left knee, initial encounter: Secondary | ICD-10-CM | POA: Diagnosis not present

## 2021-12-27 DIAGNOSIS — W19XXXA Unspecified fall, initial encounter: Secondary | ICD-10-CM

## 2021-12-27 MED ORDER — OXYCODONE-ACETAMINOPHEN 5-325 MG PO TABS
1.0000 | ORAL_TABLET | Freq: Once | ORAL | Status: AC
  Administered 2021-12-27: 1 via ORAL
  Filled 2021-12-27: qty 1

## 2021-12-27 MED ORDER — MELOXICAM 7.5 MG PO TABS
15.0000 mg | ORAL_TABLET | Freq: Once | ORAL | Status: AC
  Administered 2021-12-27: 15 mg via ORAL
  Filled 2021-12-27: qty 2

## 2021-12-27 MED ORDER — MELOXICAM 15 MG PO TABS: 15.0000 mg | ORAL_TABLET | Freq: Every day | ORAL | 0 refills | Status: AC

## 2021-12-27 MED ORDER — OXYCODONE-ACETAMINOPHEN 5-325 MG PO TABS: 1.0000 | ORAL_TABLET | Freq: Four times a day (QID) | ORAL | 0 refills | Status: AC | PRN

## 2021-12-27 NOTE — ED Triage Notes (Signed)
Pt to ED via [facility] from home for a fall. Pt fell approximately 4 feet from a tree. Pt states that he did not hit his head when he fell. Pt states that he is having pain in the back side of his left knee. Pt is in NAD.

## 2021-12-27 NOTE — ED Provider Notes (Signed)
[facility] Provider Note  Patient Contact: 8:19 PM (approximate)   History   Fall and Leg Pain   HPI  Colin Simpson is a 56 y.o. male who presents the emergency department complaining of left knee injury/pain.  Patient states that he fell backwards, landed with his knee against the ground.  Patient is hurting along the posterior knee.  No pain in the hip, ankle joint.  Did not hit his head or lose consciousness.  Patient denies any other injury or complaint.  No open wounds.  No history of previous injuries to the knee.     Physical Exam   Triage Vital Signs: ED Triage Vitals  Enc Vitals Group     BP 12/27/21 1807 (!) 108/52     Pulse Rate 12/27/21 1807 60     Resp 12/27/21 1807 16     Temp 12/27/21 1807 98.4 F (36.9 C)     Temp Source 12/27/21 1807 Oral     SpO2 12/27/21 1807 100 %     Weight --      Height --      Head Circumference --      Peak Flow --      Pain Score 12/27/21 1805 9     Pain Loc --      Pain Edu? --      Excl. in GC? --     Most recent vital signs: Vitals:   12/27/21 1807  BP: (!) 108/52  Pulse: 60  Resp: 16  Temp: 98.4 F (36.9 C)  SpO2: 100%     General: Alert and in no acute distress. Cardiovascular:  Good peripheral perfusion Respiratory: Normal respiratory effort without tachypnea or retractions. Lungs CTAB.  Musculoskeletal: Full range of motion to all extremities.  Visualization of the left knee reveals no gross deformity or edema.  No ecchymosis.  No open wounds.  Patient is tender over the popliteal fossa.  No palpable abnormality.  Varus, valgus, Lachman's, McMurray's is negative.  Patient is able to flex and extend the knee.  Dorsalis pedis pulses sensation intact distally.  Examination of the hip and ankle is unremarkable. Neurologic:  No gross focal neurologic deficits are appreciated.  Skin:   No rash noted Other:   ED Results / Procedures / Treatments   Labs (all labs ordered are listed,  but only abnormal results are displayed) Labs Reviewed - No data to display   EKG     RADIOLOGY  I personally viewed, evaluated, and interpreted these images as part of my medical decision making, as well as reviewing the written report by the radiologist.  ED Provider Interpretation: No fracture or dislocation.  DG Knee Complete 4 Views Left  Result Date: 12/27/2021 CLINICAL DATA:  Status post fall EXAM: LEFT KNEE - COMPLETE 4+ VIEW COMPARISON:  None Available. FINDINGS: No fracture of the proximal tibia or distal femur. Patella is normal. No joint effusion. IMPRESSION: No fracture or dislocation. Electronically Signed   By: Genevive Bi M.D.   On: 12/27/2021 18:50    PROCEDURES:  Critical Care performed: No  Procedures   MEDICATIONS ORDERED IN ED: Medications  meloxicam (MOBIC) tablet 15 mg (has no administration in time range)  oxyCODONE-acetaminophen (PERCOCET/ROXICET) 5-325 MG per tablet 1 tablet (has no administration in time range)     IMPRESSION / MDM / ASSESSMENT AND PLAN / ED COURSE  I reviewed the triage vital signs and the nursing notes.  Differential diagnosis includes, but is not limited to, femur fracture, tibial plateau fracture, knee dislocation, ACL tear, PCL tear, MCL tear, LCL tear, quadriceps tear   Patient's presentation is most consistent with acute presentation with potential threat to life or bodily function.   Patient's diagnosis is consistent with fall, knee injury.  Patient presents emergency department complaining of posterior knee pain after a fall.  Patient fell and landed on the back of his knee.  No other injury or complaint.  Imaging is reassuring.  Negative special test of the need to be concern for acute ligamentous rupture.  Patient is given hinged knee brace, crutches.  He will have very limited prescription for pain medication and anti-inflammatories for symptom relief.  Follow-up with orthopedics if  symptoms or not improving..  Patient is given ED precautions to return to the ED for any worsening or new symptoms.        FINAL CLINICAL IMPRESSION(S) / ED DIAGNOSES   Final diagnoses:  Fall, initial encounter  Contusion of left knee, initial encounter     Rx / DC Orders   ED Discharge Orders          Ordered    meloxicam (MOBIC) 15 MG tablet  Daily        12/27/21 2122    oxyCODONE-acetaminophen (PERCOCET/ROXICET) 5-325 MG tablet  Every 6 hours PRN        12/27/21 2122             Note:  This document was prepared using Dragon voice recognition software and may include unintentional dictation errors.   Lanette Hampshire 12/27/21 2124    Concha Se, MD 12/27/21 [phone]

## 2021-12-27 NOTE — ED Provider Triage Note (Signed)
Emergency Medicine Provider Triage Evaluation Note  Colin Simpson , a 56 y.o. male  was evaluated in triage.  Pt complains of L leg pain. Larey Seat reportedly 4 feet and c/o L knee pain. No other reported injury.  Review of Systems  Positive: L knee pain Negative: Other MSK pain  Physical Exam  BP (!) 108/52 (BP Location: Left Arm)   Pulse 60   Temp 98.4 F (36.9 C) (Oral)   Resp 16   SpO2 100%  Gen:   Awake, no distress   Resp:  Normal effort  MSK:   No ROM to L knee at this time Other:    Medical Decision Making  Medically screening exam initiated at 6:54 PM.  Appropriate orders placed.  Colin Simpson was informed that the remainder of the evaluation will be completed by another provider, this initial triage assessment does not replace that evaluation, and the importance of remaining in the ED until their evaluation is complete.  Xray of knee   Racheal Patches, PA-C 12/27/21 1855

## 2022-09-20 ENCOUNTER — Other Ambulatory Visit: Payer: Self-pay

## 2022-09-20 ENCOUNTER — Encounter: Payer: Self-pay | Admitting: Emergency Medicine

## 2022-09-20 ENCOUNTER — Emergency Department
Admission: EM | Admit: 2022-09-20 | Discharge: 2022-09-20 | Disposition: A | Discharge status: Home / Self Care | Payer: Medicare HMO | Attending: Emergency Medicine | Admitting: Emergency Medicine

## 2022-09-20 DIAGNOSIS — T782XXA Anaphylactic shock, unspecified, initial encounter: Secondary | ICD-10-CM | POA: Diagnosis not present

## 2022-09-20 DIAGNOSIS — T7840XA Allergy, unspecified, initial encounter: Secondary | ICD-10-CM | POA: Diagnosis present

## 2022-09-20 MED ORDER — DIPHENHYDRAMINE HCL 50 MG/ML IJ SOLN
25.0000 mg | Freq: Once | INTRAMUSCULAR | Status: AC
  Administered 2022-09-20: 25 mg via INTRAVENOUS
  Filled 2022-09-20: qty 1

## 2022-09-20 MED ORDER — PREDNISONE 50 MG PO TABS
50.0000 mg | ORAL_TABLET | Freq: Every day | ORAL | 0 refills | Status: AC
Start: 2022-09-20 — End: 2022-09-23

## 2022-09-20 MED ORDER — EPINEPHRINE 0.3 MG/0.3ML IJ SOAJ: 0.3000 mg | INTRAMUSCULAR | 2 refills | Status: AC | PRN

## 2022-09-20 MED ORDER — FAMOTIDINE IN NACL 20-0.9 MG/50ML-% IV SOLN
20.0000 mg | Freq: Once | INTRAVENOUS | Status: AC
  Administered 2022-09-20: 20 mg via INTRAVENOUS
  Filled 2022-09-20: qty 50

## 2022-09-20 MED ORDER — METHYLPREDNISOLONE SODIUM SUCC 125 MG IJ SOLR
125.0000 mg | Freq: Once | INTRAMUSCULAR | Status: AC
  Administered 2022-09-20: 125 mg via INTRAVENOUS
  Filled 2022-09-20: qty 2

## 2022-09-20 MED ORDER — ONDANSETRON HCL 4 MG/2ML IJ SOLN
4.0000 mg | Freq: Once | INTRAMUSCULAR | Status: AC
  Administered 2022-09-20: 4 mg via INTRAVENOUS
  Filled 2022-09-20: qty 2

## 2022-09-20 NOTE — Discharge Instructions (Addendum)
You are seen in the emergency department for anaphylactic reaction from bee sting.  You were given steroids and acid reducing medications and antihistamines in the emergency department.  Your symptoms improved.  You are given a refill for EpiPen's, it is importantly keep this with you at all times.  You were given a prescription for short course of steroids.  You can take Benadryl 25 mg every 8 hours as needed for itching.  Prednisone -you are given a prescription for a steroid.  It is important that you take this medication with food.  This medication can cause an upset stomach.  It also can increase your glucose if you have a history of diabetes, so it is important that you check your glucose frequently while you are on this medication.

## 2022-09-20 NOTE — ED Triage Notes (Signed)
Patient to ED via [facility] from home for an allergic reaction. States he was stung by a bee on his left hand. Became SOB. Took his own epi pen at home PTA and given 50mg  benadryl and 2 duo-nebs by EMS.

## 2022-09-20 NOTE — ED Notes (Signed)
Patient resting comfortably at this time with family member. Respirations even and unlabored. Denies SOB at this time.

## 2022-09-20 NOTE — Progress Notes (Signed)
Chap connected Pt & Family at the ED hallway. Chap introduced spiritual care to Pt/ fam. Engaged in open ended questions accessing spiritual and emotional needs. No further needs at this time.   09/20/22 1300  Spiritual Encounters  Type of Visit Initial  Care provided to: Pt and family  Referral source Chaplain assessment  Reason for visit Routine spiritual support  OnCall Visit No  Spiritual Framework  Presenting Themes Courage hope and growth;Community and relationships  Community/Connection Family  Interventions  Spiritual Care Interventions Made Established relationship of care and support;Compassionate presence  Intervention Outcomes  Outcomes Awareness of support;Connection to spiritual care;Reduced isolation  Spiritual Care Plan  Spiritual Care Issues Still Outstanding No further spiritual care needs at this time (see row info)

## 2022-09-20 NOTE — ED Provider Notes (Signed)
[facility] Provider Note    Event Date/Time   First MD Initiated Contact with Patient 09/20/22 1120     (approximate)   History   Allergic Reaction   HPI  Colin Simpson is a 57 y.o. 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  Patient states that he was stung by bee to his left hand.  Immediately started feeling short of breath and itchy.  Gave himself his sisters EpiPen which he had with him.  States that he then called EMS.  When fire initially got it there had a slightly low blood pressure at 100 systolic.  Was given 50 mg of p.o. Benadryl.  Attempted to start an IV but was unsuccessful.  Given a DuoNeb treatment with EMS and route.  Blood pressure improved.  Continues to have some mild shortness of breath, nausea and feels like he is going to throw up.  No abdominal pain or diarrhea.  Ongoing mild shortness of breath.     Physical Exam   Triage Vital Signs: ED Triage Vitals  Encounter Vitals Group     BP 09/20/22 1125 (!) 118/52     Systolic BP Percentile --      Diastolic BP Percentile --      Pulse Rate 09/20/22 1125 78     Resp 09/20/22 1125 18     Temp 09/20/22 1125 98.6 F (37 C)     Temp Source 09/20/22 1125 Oral     SpO2 09/20/22 1125 100 %     Weight 09/20/22 1121 275 lb (124.7 kg)     Height 09/20/22 1121 5\' 9"  (1.753 m)     Head Circumference --      Peak Flow --      Pain Score 09/20/22 1121 0     Pain Loc --      Pain Education --      Exclude from Growth Chart --     Most recent vital signs: Vitals:   09/20/22 1125 09/20/22 1200  BP: (!) 118/52 (!) 122/39  Pulse: 78 70  Resp: 18 18  Temp: 98.6 F (37 C)   SpO2: 100% 100%    Physical Exam Constitutional:      Appearance: He is well-developed.  HENT:     Head: Atraumatic.  Eyes:     Conjunctiva/sclera: Conjunctivae normal.  Cardiovascular:     Rate and Rhythm: Regular  rhythm.  Pulmonary:     Effort: No respiratory distress.     Breath sounds: No wheezing.  Musculoskeletal:     Cervical back: Normal range of motion.  Skin:    General: Skin is warm.     Capillary Refill: Capillary refill takes less than 2 seconds.     Comments: Sting with no obvious retained stinger to the left hand.  Neurological:     Mental Status: He is alert. Mental status is at baseline.      IMPRESSION / MDM / ASSESSMENT AND PLAN / ED COURSE  I reviewed the triage vital signs and the nursing notes.  Differential diagnosis including anaphylactic reaction, allergic reaction  Patient received epinephrine prior to arrival and p.o. Benadryl   Labs (all labs ordered are listed, but only abnormal results are displayed) Labs interpreted as -    Labs Reviewed - No data to display    Treated with IV Benadryl, Pepcid, Solu-Medrol.  Patient completed his DuoNeb treatment.  Monitored  in the emergency department without any recurrence of his allergic reaction or anaphylactic reaction.  Do not feel that repeat dose of epinephrine is necessary at this time.  Given a prescription and refill for his EpiPen's.  Given a short course of steroids.  Discussed Benadryl for itchiness.  Given return precautions for any recurrence of his symptoms.  Discussed close follow-up with primary care physician.  No questions at time of discharge.   PROCEDURES:  Critical Care performed: yes  .Critical Care  Performed by: Corena Herter, MD Authorized by: Corena Herter, MD   Critical care provider statement:    Critical care time (minutes):  30   Critical care time was exclusive of:  Separately billable procedures and treating other patients   Critical care was necessary to treat or prevent imminent or life-threatening deterioration of the following conditions: Anaphylactic reaction.   Critical care was time spent personally by me on the following activities:  Development of treatment plan with  patient or surrogate, discussions with consultants, evaluation of patient's response to treatment, examination of patient, ordering and review of laboratory studies, ordering and review of radiographic studies, ordering and performing treatments and interventions, pulse oximetry, re-evaluation of patient's condition and review of old charts   Patient's presentation is most consistent with acute presentation with potential threat to life or bodily function.   MEDICATIONS ORDERED IN ED: Medications  diphenhydrAMINE (BENADRYL) injection 25 mg (25 mg Intravenous Given 09/20/22 1130)  ondansetron (ZOFRAN) injection 4 mg (4 mg Intravenous Given 09/20/22 1130)  methylPREDNISolone sodium succinate (SOLU-MEDROL) 125 mg/2 mL injection 125 mg (125 mg Intravenous Given 09/20/22 1131)  famotidine (PEPCID) IVPB 20 mg premix (0 mg Intravenous Stopped 09/20/22 1211)    FINAL CLINICAL IMPRESSION(S) / ED DIAGNOSES   Final diagnoses:  Anaphylaxis, initial encounter     Rx / DC Orders   ED Discharge Orders          Ordered    EPINEPHrine 0.3 mg/0.3 mL IJ SOAJ injection  As needed        09/20/22 1248    predniSONE (DELTASONE) 50 MG tablet  Daily with breakfast        09/20/22 1248             Note:  This document was prepared using Dragon voice recognition software and may include unintentional dictation errors.   Corena Herter, MD 09/20/22 1251

## 2023-11-08 IMAGING — CR DG CHEST 2V
2 series · 2 of 2 positions shown · non-contrast
Comparison: 10/13/2016

CLINICAL DATA: 56-year-old male with cough and fever.

EXAM:
CHEST - 2 VIEW

[chest lat]
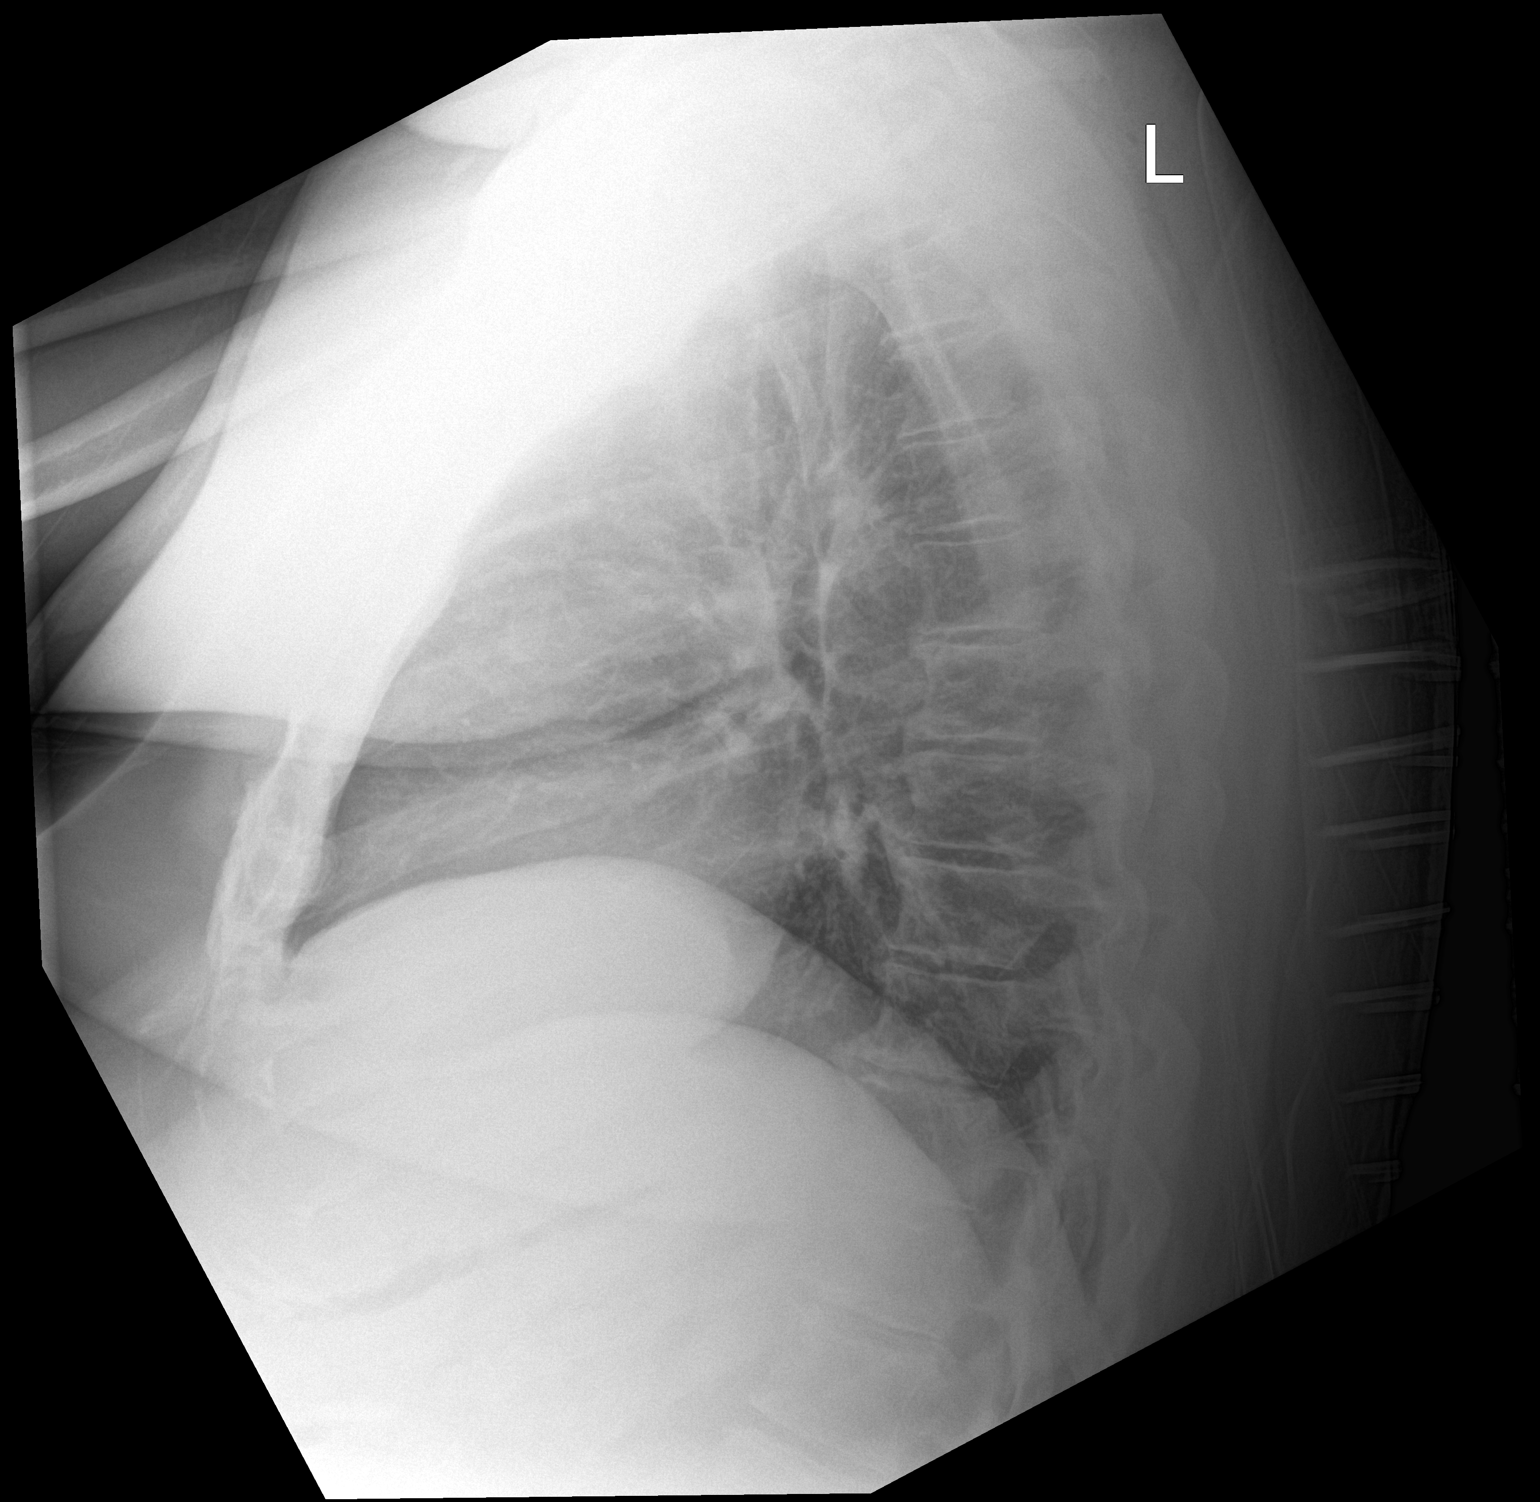

[chest ap]
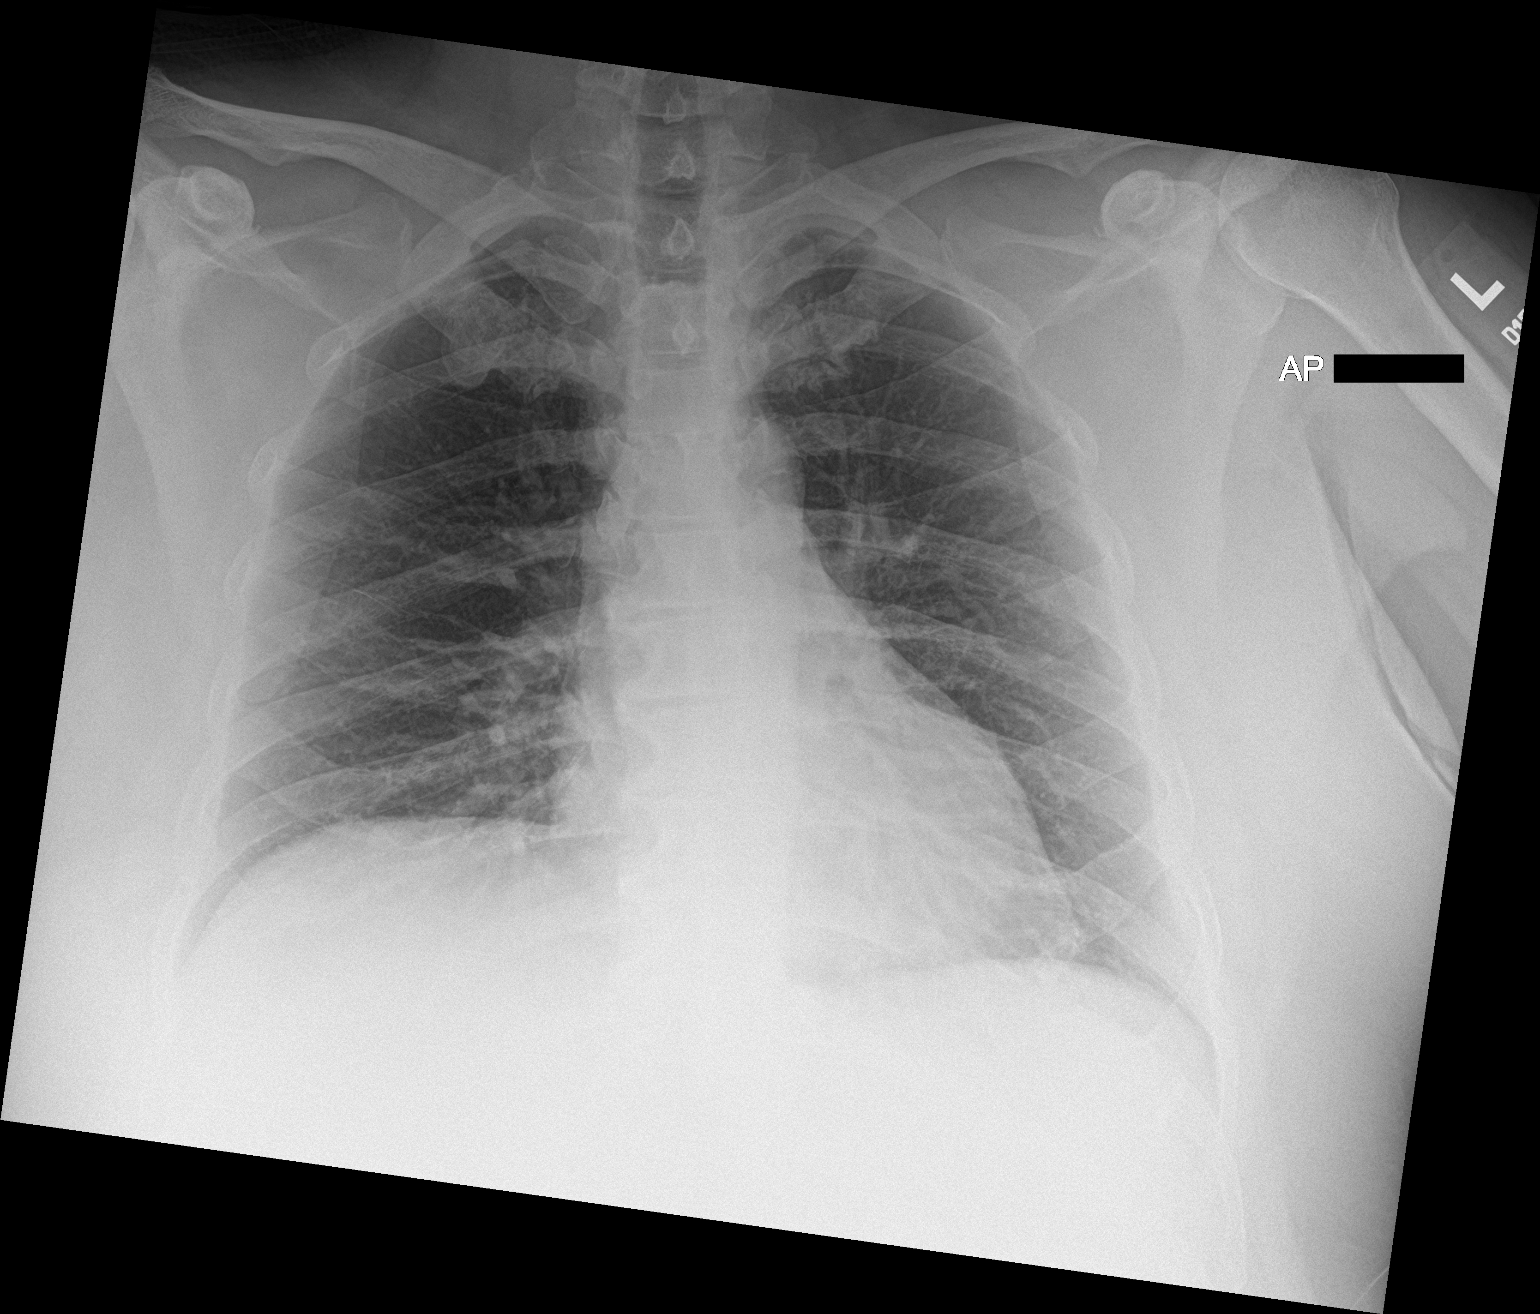

[2 of 2 positions shown; findings below may reference images not displayed]

FINDINGS: The mediastinal contours are within normal limits. No cardiomegaly.
Similar appearing elevation the right hemidiaphragm. The lungs are
clear bilaterally without evidence of focal consolidation, pleural
effusion, or pneumothorax. No acute osseous abnormality.
IMPRESSION: No acute cardiopulmonary process.

## 2023-11-08 IMAGING — CT CT RENAL STONE PROTOCOL
2 of 4 series · 16 of 46 positions shown, 18 images · non-contrast
Comparison: None.

CLINICAL DATA: Urinary incontinence, flank pain.



[Series 2: stone full standard · axial · 0.83mm/px · z∈[-968,-488]mm · 13 of 106 slices shown, 15 images]
[im 5/106  soft-tissue]
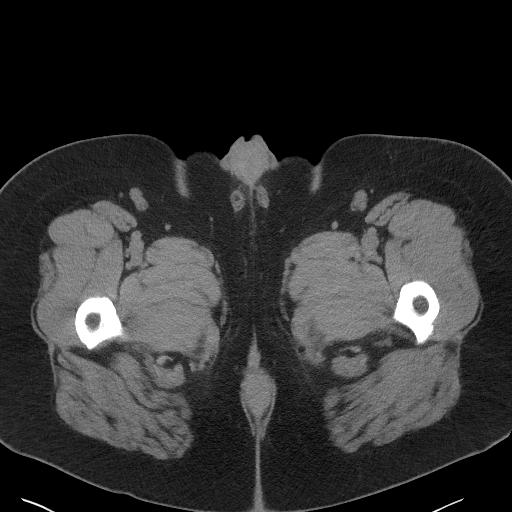
[im 5/106  bone]
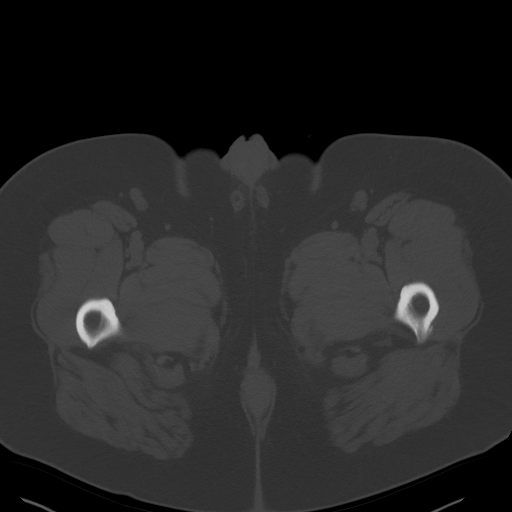
[im 14/106  soft-tissue]
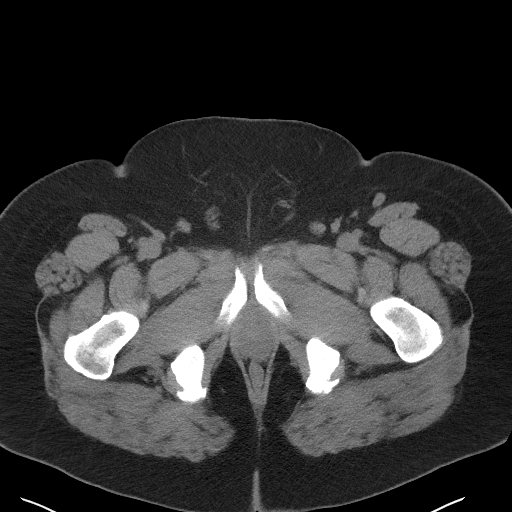
[im 22/106  soft-tissue]
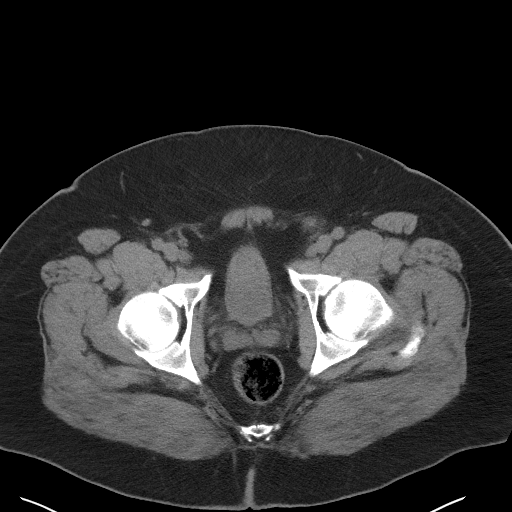
[im 31/106  soft-tissue]
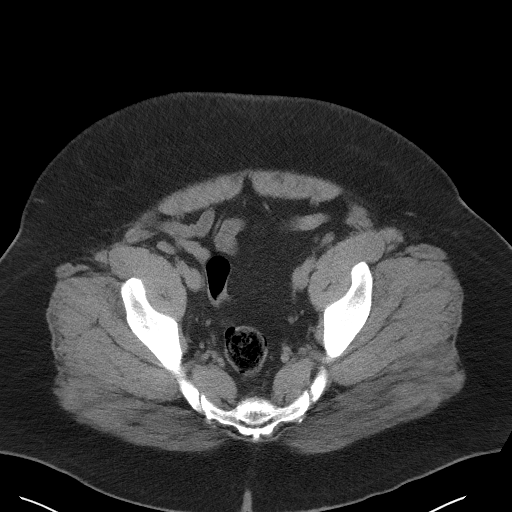
[im 36/106  soft-tissue]
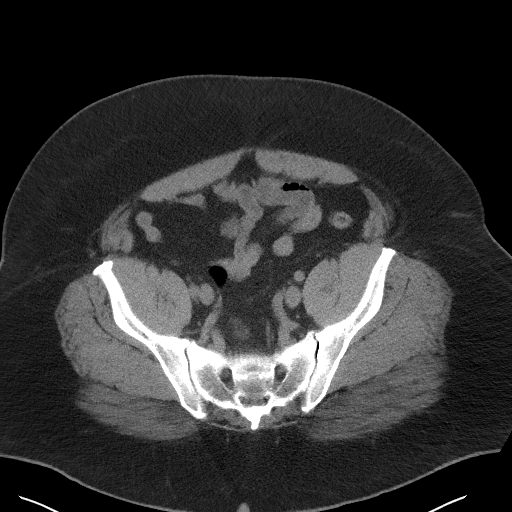
[im 44/106  soft-tissue]
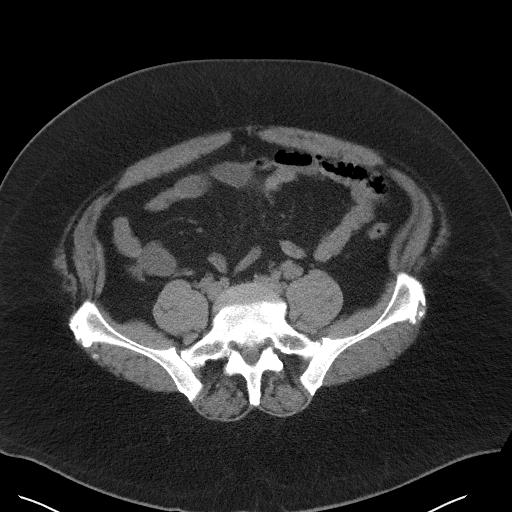
[im 53/106  soft-tissue]
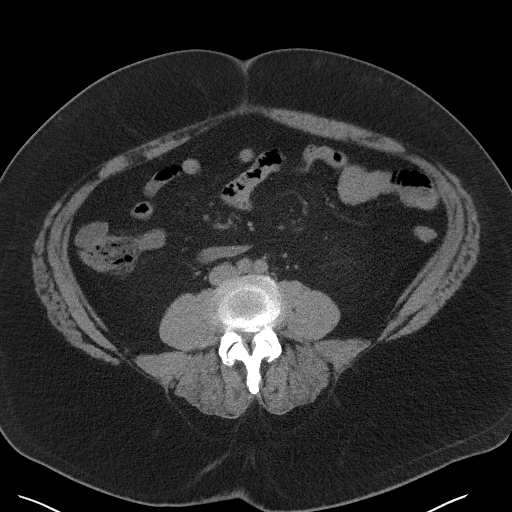
[im 62/106  soft-tissue]
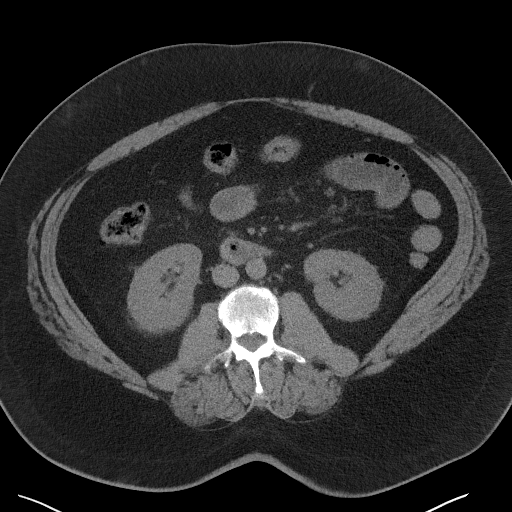
[im 71/106  soft-tissue]
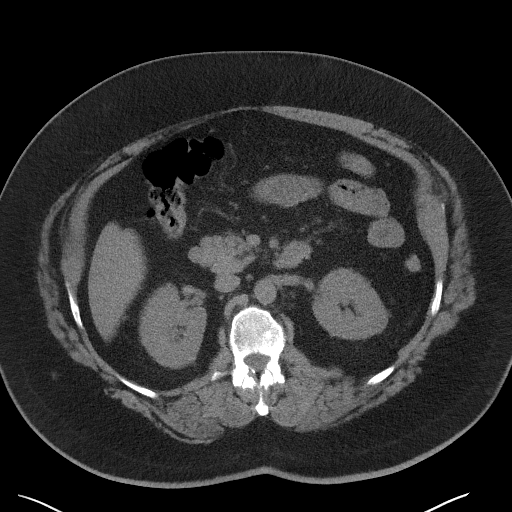
[im 71/106  bone]
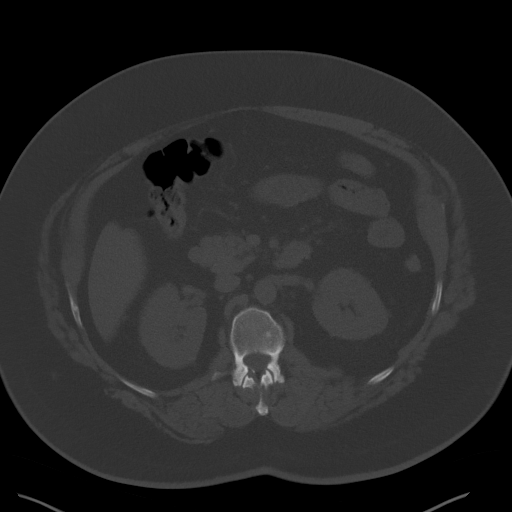
[im 75/106  soft-tissue]
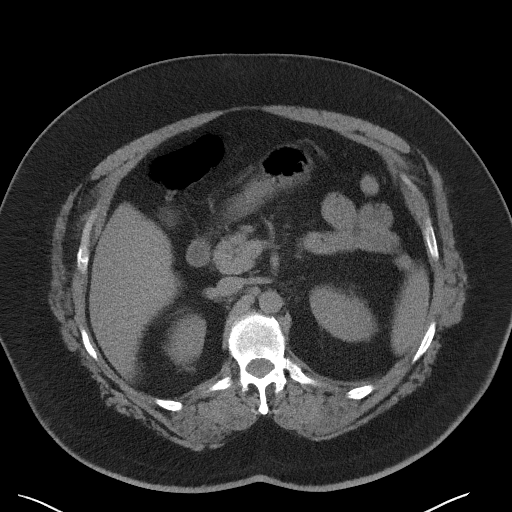
[im 84/106  soft-tissue]
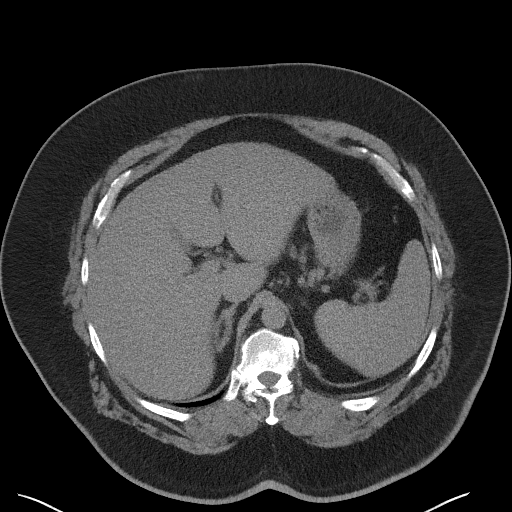
[im 92/106  soft-tissue]
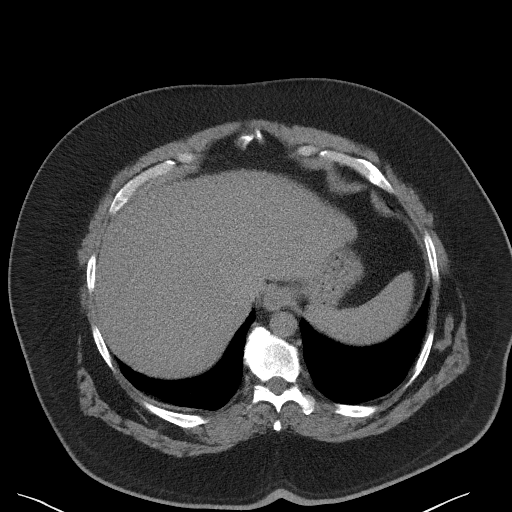
[im 101/106  soft-tissue]
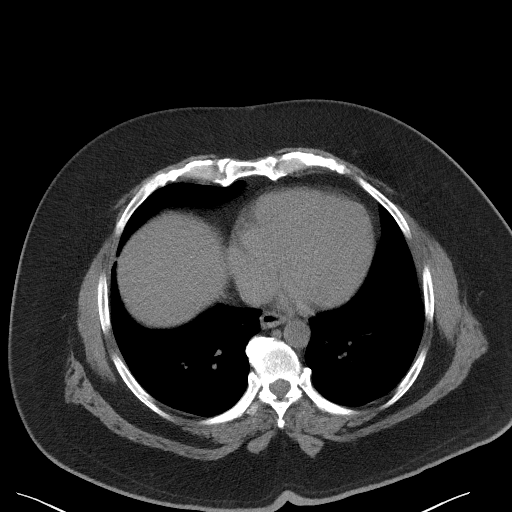

[Series 5: coronal · coronal · 0.95mm/px · 3 of 199 slices shown]
[im 67/199  soft-tissue]
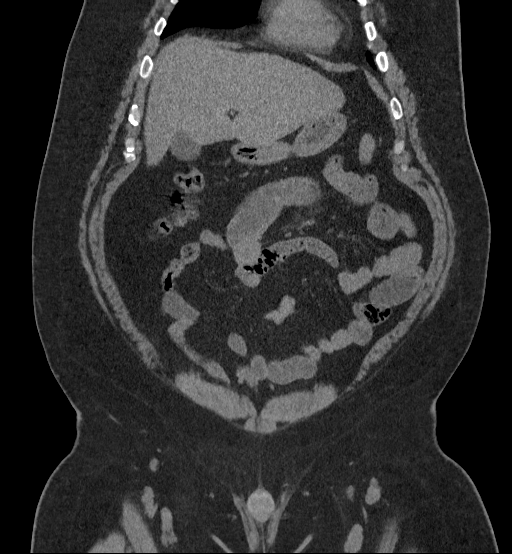
[im 89/199  soft-tissue]
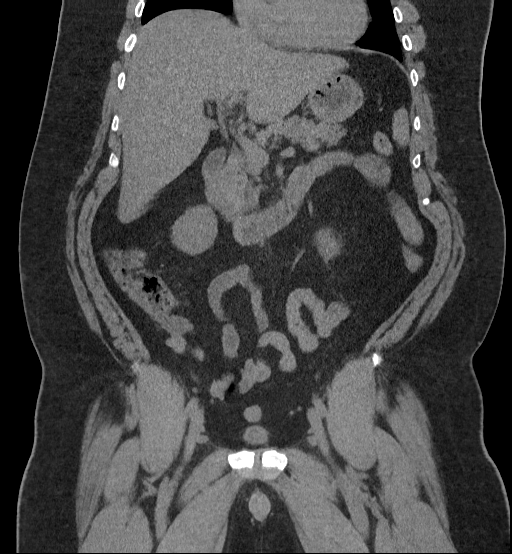
[im 111/199  soft-tissue]
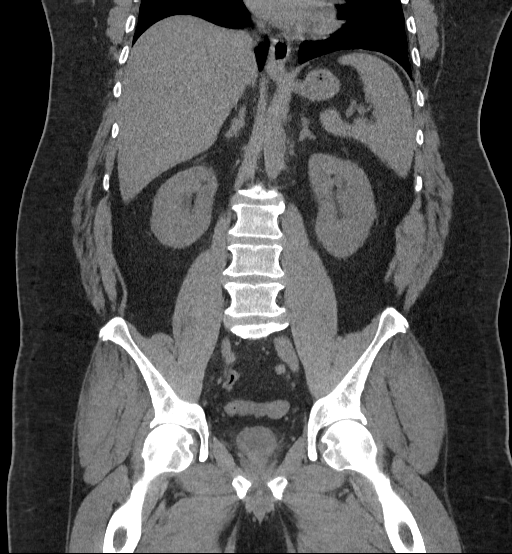

[16 of 46 positions shown; findings below may reference images not displayed]

FINDINGS: Lower chest: Lung bases are clear. Heart is at the upper limits of
normal in size to mildly enlarged. No pericardial or pleural
effusion. Distal esophagus is grossly unremarkable.

Hepatobiliary: Liver is enlarged, 21.2 cm. Liver and gallbladder are
otherwise unremarkable. No biliary ductal dilatation.

Pancreas: Negative.

Spleen: Negative.

Adrenals/Urinary Tract: Adrenal glands and kidneys are unremarkable.
No urinary stones. Ureters are decompressed. Bladder is low in
volume.

Stomach/Bowel: Stomach, small bowel, appendix and colon are
unremarkable.

Vascular/Lymphatic: Vascular structures are unremarkable. No
abdominal peritoneal ligament, abdominal retroperitoneal or
mesenteric adenopathy. Inguinal lymph nodes measure up to 1.6 cm on
the left. Iliac chain lymph nodes more prominent on the left,
measuring up to 11 mm in the left external iliac chain.

Reproductive: Prostate is normal in size.

Other: Small bilateral inguinal hernias contain fat. No free fluid.
Mesenteries and peritoneum are unremarkable.

Musculoskeletal: Degenerative changes in the spine. No worrisome
lytic or sclerotic lesions.
IMPRESSION: 1. No acute findings to explain the patient's clinical history.
2. Hepatomegaly.
3. Small to borderline enlarged pelvic and inguinal lymph nodes,
possibly reactive. Difficult to exclude a lymphoproliferative
disorder.
